# Patient Record
Sex: Female | Born: 1960 | Race: Black or African American | Hispanic: No | Marital: Single | Smoking: Never smoker
Health system: Southern US, Community
[De-identification: ages and names within clinical notes are randomized; demographics above are authoritative.]

---

## 2021-08-18 ENCOUNTER — Emergency Department (HOSPITAL_COMMUNITY): Payer: Medicaid Other

## 2021-08-18 ENCOUNTER — Other Ambulatory Visit: Payer: Self-pay

## 2021-08-18 ENCOUNTER — Inpatient Hospital Stay (HOSPITAL_COMMUNITY)
Admission: EM | Admit: 2021-08-18 | Discharge: 2021-09-06 | DRG: 683 | Disposition: A | Discharge status: Home / Self Care | Payer: Medicaid Other | Attending: Internal Medicine | Admitting: Internal Medicine

## 2021-08-18 DIAGNOSIS — K7031 Alcoholic cirrhosis of liver with ascites: Secondary | ICD-10-CM | POA: Diagnosis present

## 2021-08-18 DIAGNOSIS — E440 Moderate protein-calorie malnutrition: Secondary | ICD-10-CM | POA: Diagnosis present

## 2021-08-18 DIAGNOSIS — Z882 Allergy status to sulfonamides status: Secondary | ICD-10-CM

## 2021-08-18 DIAGNOSIS — R45851 Suicidal ideations: Secondary | ICD-10-CM | POA: Diagnosis present

## 2021-08-18 DIAGNOSIS — F4310 Post-traumatic stress disorder, unspecified: Secondary | ICD-10-CM | POA: Diagnosis present

## 2021-08-18 DIAGNOSIS — I5022 Chronic systolic (congestive) heart failure: Secondary | ICD-10-CM | POA: Diagnosis present

## 2021-08-18 DIAGNOSIS — I071 Rheumatic tricuspid insufficiency: Secondary | ICD-10-CM | POA: Diagnosis present

## 2021-08-18 DIAGNOSIS — J9611 Chronic respiratory failure with hypoxia: Secondary | ICD-10-CM | POA: Diagnosis present

## 2021-08-18 DIAGNOSIS — Z86718 Personal history of other venous thrombosis and embolism: Secondary | ICD-10-CM

## 2021-08-18 DIAGNOSIS — Z9151 Personal history of suicidal behavior: Secondary | ICD-10-CM

## 2021-08-18 DIAGNOSIS — Z9114 Patient's other noncompliance with medication regimen: Secondary | ICD-10-CM

## 2021-08-18 DIAGNOSIS — K7030 Alcoholic cirrhosis of liver without ascites: Secondary | ICD-10-CM

## 2021-08-18 DIAGNOSIS — Z86711 Personal history of pulmonary embolism: Secondary | ICD-10-CM

## 2021-08-18 DIAGNOSIS — H547 Unspecified visual loss: Secondary | ICD-10-CM | POA: Diagnosis present

## 2021-08-18 DIAGNOSIS — Z7901 Long term (current) use of anticoagulants: Secondary | ICD-10-CM

## 2021-08-18 DIAGNOSIS — G4700 Insomnia, unspecified: Secondary | ICD-10-CM | POA: Diagnosis present

## 2021-08-18 DIAGNOSIS — R0789 Other chest pain: Secondary | ICD-10-CM | POA: Diagnosis present

## 2021-08-18 DIAGNOSIS — Z91199 Patient's noncompliance with other medical treatment and regimen due to unspecified reason: Secondary | ICD-10-CM

## 2021-08-18 DIAGNOSIS — Z20822 Contact with and (suspected) exposure to covid-19: Secondary | ICD-10-CM | POA: Diagnosis present

## 2021-08-18 DIAGNOSIS — Z91041 Radiographic dye allergy status: Secondary | ICD-10-CM

## 2021-08-18 DIAGNOSIS — I2699 Other pulmonary embolism without acute cor pulmonale: Secondary | ICD-10-CM

## 2021-08-18 DIAGNOSIS — Z28310 Unvaccinated for covid-19: Secondary | ICD-10-CM

## 2021-08-18 DIAGNOSIS — Z888 Allergy status to other drugs, medicaments and biological substances status: Secondary | ICD-10-CM

## 2021-08-18 DIAGNOSIS — R9431 Abnormal electrocardiogram [ECG] [EKG]: Secondary | ICD-10-CM

## 2021-08-18 DIAGNOSIS — Z95828 Presence of other vascular implants and grafts: Secondary | ICD-10-CM

## 2021-08-18 DIAGNOSIS — Z885 Allergy status to narcotic agent status: Secondary | ICD-10-CM

## 2021-08-18 DIAGNOSIS — N179 Acute kidney failure, unspecified: Principal | ICD-10-CM | POA: Diagnosis present

## 2021-08-18 DIAGNOSIS — I110 Hypertensive heart disease with heart failure: Secondary | ICD-10-CM | POA: Diagnosis present

## 2021-08-18 DIAGNOSIS — D72819 Decreased white blood cell count, unspecified: Secondary | ICD-10-CM | POA: Diagnosis present

## 2021-08-18 DIAGNOSIS — I482 Chronic atrial fibrillation, unspecified: Secondary | ICD-10-CM

## 2021-08-18 DIAGNOSIS — E8809 Other disorders of plasma-protein metabolism, not elsewhere classified: Secondary | ICD-10-CM | POA: Diagnosis present

## 2021-08-18 DIAGNOSIS — Z681 Body mass index (BMI) 19 or less, adult: Secondary | ICD-10-CM

## 2021-08-18 DIAGNOSIS — Z5901 Sheltered homelessness: Secondary | ICD-10-CM

## 2021-08-18 DIAGNOSIS — F332 Major depressive disorder, recurrent severe without psychotic features: Secondary | ICD-10-CM | POA: Diagnosis present

## 2021-08-18 DIAGNOSIS — Z79899 Other long term (current) drug therapy: Secondary | ICD-10-CM

## 2021-08-18 DIAGNOSIS — Z9981 Dependence on supplemental oxygen: Secondary | ICD-10-CM

## 2021-08-18 DIAGNOSIS — I4821 Permanent atrial fibrillation: Secondary | ICD-10-CM | POA: Diagnosis present

## 2021-08-18 DIAGNOSIS — I959 Hypotension, unspecified: Secondary | ICD-10-CM | POA: Diagnosis present

## 2021-08-18 LAB — BASIC METABOLIC PANEL
Anion gap: 12 (ref 5–15)
BUN: 18 mg/dL (ref 6–20)
CO2: 24 mmol/L (ref 22–32)
Calcium: 10.9 mg/dL — ABNORMAL HIGH (ref 8.9–10.3)
Chloride: 99 mmol/L (ref 98–111)
Creatinine, Ser: 2 mg/dL — ABNORMAL HIGH (ref 0.44–1.00)
GFR, Estimated: 28 mL/min — ABNORMAL LOW (ref >60)
Glucose, Bld: 92 mg/dL (ref 70–99)
Potassium: 4 mmol/L (ref 3.5–5.1)
Sodium: 135 mmol/L (ref 135–145)

## 2021-08-18 LAB — CBC WITH DIFFERENTIAL/PLATELET
Abs Immature Granulocytes: 0.01 10*3/uL (ref 0.00–0.07)
Basophils Absolute: 0 10*3/uL (ref 0.0–0.1)
Basophils Relative: 0 %
Eosinophils Absolute: 0 10*3/uL (ref 0.0–0.5)
Eosinophils Relative: 1 %
HCT: 41.2 % (ref 36.0–46.0)
Hemoglobin: 13 g/dL (ref 12.0–15.0)
Immature Granulocytes: 0 %
Lymphocytes Relative: 26 %
Lymphs Abs: 1.2 10*3/uL (ref 0.7–4.0)
MCH: 30.1 pg (ref 26.0–34.0)
MCHC: 31.6 g/dL (ref 30.0–36.0)
MCV: 95.4 fL (ref 80.0–100.0)
Monocytes Absolute: 0.6 10*3/uL (ref 0.1–1.0)
Monocytes Relative: 13 %
Neutro Abs: 2.7 10*3/uL (ref 1.7–7.7)
Neutrophils Relative %: 60 %
Platelets: 283 10*3/uL (ref 150–400)
RBC: 4.32 MIL/uL (ref 3.87–5.11)
RDW: 16.2 % — ABNORMAL HIGH (ref 11.5–15.5)
WBC: 4.6 10*3/uL (ref 4.0–10.5)
nRBC: 0 % (ref 0.0–0.2)

## 2021-08-18 LAB — TROPONIN I (HIGH SENSITIVITY): Troponin I (High Sensitivity): 15 ng/L (ref <18)

## 2021-08-18 MED ORDER — FERROUS SULFATE 325 (65 FE) MG PO TABS: 325.0000 mg | ORAL_TABLET | Freq: Every day | ORAL | Status: DC

## 2021-08-18 MED ORDER — SODIUM CHLORIDE 0.9 % IV BOLUS
500.0000 mL | Freq: Once | INTRAVENOUS | Status: AC
  Administered 2021-08-18: 500 mL via INTRAVENOUS

## 2021-08-18 MED ORDER — PANTOPRAZOLE SODIUM 20 MG PO TBEC: 20.0000 mg | DELAYED_RELEASE_TABLET | Freq: Every day | ORAL | Status: DC

## 2021-08-18 MED ORDER — PANTOPRAZOLE SODIUM 20 MG PO TBEC
20.0000 mg | DELAYED_RELEASE_TABLET | Freq: Every day | ORAL | Status: DC
  Administered 2021-08-19 – 2021-09-06 (×19): 20 mg via ORAL
  Filled 2021-08-18 (×19): qty 1

## 2021-08-18 MED ORDER — APIXABAN 5 MG PO TABS
5.0000 mg | ORAL_TABLET | Freq: Two times a day (BID) | ORAL | Status: DC
  Administered 2021-08-19 – 2021-09-06 (×37): 5 mg via ORAL
  Filled 2021-08-18 (×39): qty 1

## 2021-08-18 MED ORDER — SPIRONOLACTONE 25 MG PO TABS
25.0000 mg | ORAL_TABLET | Freq: Every day | ORAL | Status: DC
  Administered 2021-08-19: 25 mg via ORAL
  Filled 2021-08-18: qty 1

## 2021-08-18 MED ORDER — METOPROLOL TARTRATE 25 MG PO TABS
25.0000 mg | ORAL_TABLET | Freq: Two times a day (BID) | ORAL | Status: DC
  Administered 2021-08-19: 25 mg via ORAL
  Filled 2021-08-18: qty 1

## 2021-08-18 MED ORDER — TORSEMIDE 20 MG PO TABS
10.0000 mg | ORAL_TABLET | Freq: Every day | ORAL | Status: DC
  Administered 2021-08-19: 10 mg via ORAL
  Filled 2021-08-18: qty 1

## 2021-08-18 MED ORDER — FOLIC ACID 1 MG PO TABS
1.0000 mg | ORAL_TABLET | Freq: Every day | ORAL | Status: DC
  Administered 2021-08-19 – 2021-09-06 (×19): 1 mg via ORAL
  Filled 2021-08-18 (×19): qty 1

## 2021-08-18 MED ORDER — FERROUS SULFATE 325 (65 FE) MG PO TABS
325.0000 mg | ORAL_TABLET | Freq: Every day | ORAL | Status: DC
  Administered 2021-08-19 – 2021-08-27 (×9): 325 mg via ORAL
  Filled 2021-08-18 (×9): qty 1

## 2021-08-18 MED ORDER — TORSEMIDE 20 MG PO TABS: 10.0000 mg | ORAL_TABLET | Freq: Every day | ORAL | Status: DC

## 2021-08-18 MED ORDER — THIAMINE HCL 100 MG PO TABS
100.0000 mg | ORAL_TABLET | Freq: Every day | ORAL | Status: DC
  Administered 2021-08-19 – 2021-09-06 (×20): 100 mg via ORAL
  Filled 2021-08-18 (×20): qty 1

## 2021-08-18 MED ORDER — FOLIC ACID 1 MG PO TABS: 1.0000 mg | ORAL_TABLET | Freq: Every day | ORAL | Status: DC

## 2021-08-18 NOTE — ED Provider Notes (Signed)
°  Provider Note MRN:  [id number]  Arrival date & time: 08/19/21    ED Course and Medical Decision Making  Assumed care from Dr. Fredderick Phenix at shift change.  Recent discharge from [facility], brought here by EMS after shelters in the area did not have any room for her.  Found to have a bit of an increased creatinine, soft blood pressures, hospitalist consulted for admission.  Procedures  Final Clinical Impressions(s) / ED Diagnoses     ICD-10-CM   1. Hypotension, unspecified hypotension type  I95.9       ED Discharge Orders     None        Elmer Sow. Pilar Plate, MD [facility] Emergency Medicine [facility] [email or web address]    Sabas Sous, MD 08/19/21 [phone]

## 2021-08-18 NOTE — ED Provider Notes (Signed)
[facility] EMERGENCY DEPARTMENT Provider Note   CSN: [id number] Arrival date & time: 08/18/21  1700     History Chief Complaint  Patient presents with   Chest Pain    Adrienne Villarreal is a 60 y.o. female.  Patient is a 60 year old female with a history of prior alcohol abuse, alcoholic cirrhosis, atrial fibrillation and PEs on Eliquis, anxiety, chronic respiratory failure on oxygen at 3 L/min baseline.  She had a recent hospitalization at [facility] from December 8 and was discharged today.  She was found to be hypoxic and fluid overloaded.  She was diuresed.  She had a paracentesis.  She was also found to be noncompliant with her Eliquis and had PEs.  She said that she had been having some chest pain and vomiting while she was in the hospital.  She was discharged to a alcoholic rehab center.  This was today.  Apparently when she got there, she said that the center said that she was not allowed to be there because she had a walker.  The driver who took her there drove her to a homeless shelter in [place] but they did not have a bed available.  She was feeling generally weak and was brought here for further evaluation.  She says she has been generally weak since she was in the hospital.  She had some cold numb hands while she was being seen at the homeless shelter.  She does have a bit of ongoing chest pain but she says it similar although better than when she was in the hospital.  It is left-sided.  No current shortness of breath.  She says she does continue to drink alcohol but has not had any today since she left the hospital.  She has a prior history of drug use but denies any drug use.  She currently feels like she needs to go to an assisted living facility.      No past medical history on file.  There are no problems to display for this patient.    The histories are not reviewed yet. Please review them in the "History" navigator section and refresh  this [place].   OB History   No obstetric history on file.     No family history on file.     Home Medications Prior to Admission medications   Medication Sig Start Date End Date Taking? Authorizing Provider  apixaban (ELIQUIS) 5 MG TABS tablet Take by mouth. 06/19/21  Yes [provider]  Cholecalciferol 25 MCG (1000 UT) tablet Take by mouth. 08/18/21 09/17/21 Yes [provider]  ferrous sulfate 325 (65 FE) MG tablet Take by mouth. 08/18/21 09/17/21 Yes [provider]  folic acid (FOLVITE) 1 MG tablet Take by mouth. 06/19/21 09/17/21 Yes [provider]  LORazepam (ATIVAN) 0.5 MG tablet Take 0.5 mg by mouth every 6 (six) hours as needed for anxiety or sedation.   Yes [provider]  metoprolol tartrate (LOPRESSOR) 25 MG tablet Take by mouth. 06/19/21 09/17/21 Yes [provider]  morphine (MSIR) 15 MG tablet Take 15 mg by mouth every 6 (six) hours as needed for severe pain.   Yes [provider]  pantoprazole (PROTONIX) 40 MG tablet Take by mouth. 08/18/21 09/17/21 Yes [provider]  spironolactone (ALDACTONE) 25 MG tablet Take by mouth. 06/19/21 09/17/21 Yes [provider]  thiamine 100 MG tablet Take by mouth. 06/19/21 09/17/21 Yes [provider]  torsemide (DEMADEX) 10 MG tablet Take by  mouth. 08/18/21 09/17/21 Yes [provider]    Allergies    Patient has no allergy information on record.  Review of Systems   Review of Systems  Constitutional:  Positive for fatigue. Negative for chills, diaphoresis and fever.  HENT:  Negative for congestion, rhinorrhea and sneezing.   Eyes: Negative.   Respiratory:  Negative for cough, chest tightness and shortness of breath.   Cardiovascular:  Positive for chest pain. Negative for leg swelling.  Gastrointestinal:  Negative for abdominal pain, blood in stool, diarrhea, nausea and vomiting.  Genitourinary:  Negative for difficulty urinating,  flank pain, frequency and hematuria.  Musculoskeletal:  Negative for arthralgias and back pain.  Skin:  Negative for rash.  Neurological:  Negative for dizziness, speech difficulty, weakness, numbness and headaches.   Physical Exam Updated Vital Signs BP (!) 87/60    Pulse 89    Temp (!) 97.5 F (36.4 C) (Oral)    Resp 16    Ht 5\' 3"  (1.6 m)    Wt 49.9 kg    SpO2 99%    BMI 19.49 kg/m   Physical Exam Constitutional:      Appearance: She is well-developed.  HENT:     Head: Normocephalic and atraumatic.  Eyes:     Pupils: Pupils are equal, round, and reactive to light.  Cardiovascular:     Rate and Rhythm: Normal rate and regular rhythm.     Heart sounds: Murmur heard.  Pulmonary:     Effort: Pulmonary effort is normal. No respiratory distress.     Breath sounds: Normal breath sounds. No wheezing or rales.  Chest:     Chest wall: No tenderness.  Abdominal:     General: Bowel sounds are normal.     Palpations: Abdomen is soft.     Tenderness: There is no abdominal tenderness. There is no guarding or rebound.  Musculoskeletal:        General: Normal range of motion.     Cervical back: Normal range of motion and neck supple.     Right lower leg: No edema.     Left lower leg: No edema.  Lymphadenopathy:     Cervical: No cervical adenopathy.  Skin:    General: Skin is warm and dry.     Findings: No rash.  Neurological:     Mental Status: She is alert and oriented to person, place, and time.    ED Results / Procedures / Treatments   Labs (all labs ordered are listed, but only abnormal results are displayed) Labs Reviewed  BASIC METABOLIC PANEL - Abnormal; Notable for the following components:      Result Value   Creatinine, Ser 2.00 (*)    Calcium 10.9 (*)    GFR, Estimated 28 (*)    All other components within normal limits  CBC WITH DIFFERENTIAL/PLATELET - Abnormal; Notable for the following components:   RDW 16.2 (*)    All other components within normal limits   TROPONIN I (HIGH SENSITIVITY)  TROPONIN I (HIGH SENSITIVITY)    EKG EKG Interpretation  Date/Time:  Friday August 18 2021 17:13:25 EST Ventricular Rate:  88 PR Interval:    QRS Duration: 133 QT Interval:  396 QTC Calculation: 480 R Axis:   84 Text Interpretation: Atrial fibrillation IVCD, consider atypical RBBB Nonspecific T abnormalities, lateral leads No old tracing to compare Confirmed by Malvin Johns [phone]) on 08/18/2021 5:17:31 PM  Radiology DG Chest Port 1 View  Result Date: 08/18/2021 CLINICAL DATA:  Shortness of breath EXAM: PORTABLE CHEST 1 VIEW COMPARISON:  None. FINDINGS: Enlarged cardiac silhouette. Trace left pleural effusion. No focal airspace consolidation. Right midlung scarring. No pneumothorax. No acute osseous abnormality. IMPRESSION: Cardiomegaly.  Trace left pleural effusion. Electronically Signed   By: Caprice Renshaw M.D.   On: 08/18/2021 18:10    Procedures Procedures   Medications Ordered in ED Medications  apixaban (ELIQUIS) tablet 5 mg (has no administration in time range)  metoprolol tartrate (LOPRESSOR) tablet 25 mg (0 mg Oral Hold 08/18/21 2249)  spironolactone (ALDACTONE) tablet 25 mg (has no administration in time range)  thiamine tablet 100 mg (has no administration in time range)  torsemide (DEMADEX) tablet 10 mg (has no administration in time range)  pantoprazole (PROTONIX) EC tablet 20 mg (has no administration in time range)  folic acid (FOLVITE) tablet 1 mg (has no administration in time range)  ferrous sulfate tablet 325 mg (has no administration in time range)  sodium chloride 0.9 % bolus 500 mL (0 mLs Intravenous Stopped 08/18/21 2030)  sodium chloride 0.9 % bolus 500 mL (500 mLs Intravenous New Bag/Given 08/18/21 2250)    ED Course  I have reviewed the triage vital signs and the nursing notes.  Pertinent labs & imaging results that were available during my care of the patient were reviewed by me and considered in my medical  decision making (see chart for details).    MDM Rules/Calculators/A&P                         Patient is a 60 year old female who was discharged today from [facility].  She was diuresed and had a paracentesis.  She was unable to get into a rehab facility or shelter.  She came in with generalized weakness and some dizziness/lightheadedness.  She had had some chest pain which has been going on during her entire hospitalization but has improved.  Her troponins are negative.  Her EKG does not show any ischemic changes.  Her labs are nonconcerning.  Her creatinine is a bit elevated from her baseline during her hospitalization of around 1.  It is 2 today.  I initially had talked to case management who has been trying to get her into a local rehab facility.  However her blood pressures have been low.  They have been ranging between 70 systolic and 110 systolic.  She is not tachycardic.  She is not febrile.  She does not have any abdominal pain that would be concerning for SPB.  Her chest x-ray is clear.  I suspect that she potentially was over diuresed during the hospitalization.  Will hydrate with IV fluids.  I spoke with Dr.Tu with the hospitalist service.  She wanted [place] to get a full year in and call her back following that.  Care was turned over to Dr. Pilar Plate.    Final Clinical Impression(s) / ED Diagnoses Final diagnoses:  None    Rx / DC Orders ED Discharge Orders     None        Rolan Bucco, MD 08/18/21 2316

## 2021-08-18 NOTE — Social Work (Signed)
CSW met with Pt at bedside. Pt endorses SUD states her last drink was last week and that she was experiencing withdrawal symptoms while hospitalized at [facility]. Pt reports that she was d/c'd from [facility] today and sent to [facility]  for rehab but upon arrival was turned away due to requiring the use of a rollator for ambulation. Pt was then taken to a [facility] by transportation driver but as there are no shelter beds was turned away from there as well. Pt became ill once again and transport brought her to [facility]. Per Pt she [place] living [place] one [place] two [place] with her daughter. Before that she was living in [facility] with her son. Pt reports that both of her children have "turned their back"on her and she insists that no one at [facility] call her children or she will file a lawsuit for breech of privacy. Pt states that she wishes to go to a nursing home and is willing to give over her $900/ monthly disability check for room and board. CSW explained that ED does not place Pt's in assisted living because it takes time.  CSW asked Pt if she has any money left from her disability and if she could afford a motel room. Pt states that she does not wish to spend money on a motel room because she needs that money to eat.  CSW will attempt to locate a rehab facility that will accept a PT with a rollater.

## 2021-08-18 NOTE — ED Notes (Signed)
Patient provided with crackers and cheese per request

## 2021-08-18 NOTE — Progress Notes (Signed)
Pt states that she has already contacted [facility] and put her name on the waitlist.

## 2021-08-18 NOTE — Progress Notes (Addendum)
CSW called the following : [facility]-closed for the night [employer] -accepts only [facility] [place] only [facility]-closed  [employer] [place] only [facility] [place] only [employer] [place]-Closed  [place]. Pt states her ID lists [place] as her residence.

## 2021-08-18 NOTE — Discharge Instructions (Addendum)
[place] at [facility] [address], [place], [place] [address]  [phone]  [place] at [facility] [address] [place], [place] [address]  [phone]  [facility] [place]-Accepts Medicaid [address] Madelaine Bhat [place], [place]  [phone] [place], [place], [place] [address]  [phone]  [facility] [address], [place], [place] [address]  [phone]  [place] Medicaid [place], [place], [place] [address]  [phone] [address] [place], [place] [address]  [phone]  [facility] [place], [place], [place] [address]  [phone]  [facility] [address], [place], [place] [address]  [phone]  [facility]-Accepts Medicaid. [place], [place], [place] [address]  [phone]  [place] at [place], [place], [place] [address]  [phone]  [place] Medicaid [address] [place], [place] [address]  [phone]  [place] at [facility] [address] [place], [place] [address]  [phone]  [facility] [place], [place], [place] [address]  [phone]  [facility] [address], [place], [place] [address]  [phone] [address] [place],  [address]  [phone]  [place] Medicaid [place], [place],  [address]  [phone]      Information on my medicine - ELIQUIS (apixaban)  This medication education was reviewed with me or my healthcare representative as part of my discharge preparation.  The pharmacist that spoke with me during my hospital stay was:    Why was Eliquis prescribed for you? Eliquis was prescribed for you to reduce the risk of a blood clot forming that can cause a stroke if you  have a medical condition called atrial fibrillation (a type of irregular heartbeat).  What do You need to know about Eliquis ? Take your Eliquis TWICE DAILY - one tablet in the morning and one tablet in the evening with or without food. If you have difficulty swallowing the tablet whole please discuss with your pharmacist how to take the medication safely.  Take Eliquis exactly as prescribed by your doctor and DO NOT stop taking Eliquis without talking to the doctor who prescribed the medication.  Stopping may increase your risk of developing a stroke.  Refill your prescription before you run out.  After discharge, you should have regular check-up appointments with your healthcare provider that is prescribing your Eliquis.  In the future your dose may need to be changed if your kidney function or weight changes by a significant amount or as you get older.  What do you do if you miss a dose? If you miss a dose, take it as soon as you remember on the same day and resume taking twice daily.  Do not take more than one dose of ELIQUIS at the same time to make up a missed dose.  Important Safety Information A possible side effect of Eliquis is bleeding. You should call your healthcare provider right away if you experience any of the following: Bleeding from an injury or your nose that does not stop. Unusual colored urine (red or dark brown) or unusual colored stools (red or black). Unusual bruising for unknown reasons. A serious fall or if you hit your head (even if there is no bleeding).  Some medicines may interact with Eliquis and might  increase your risk of bleeding or clotting while on Eliquis. To help avoid this, consult your healthcare provider or pharmacist prior to using any new prescription or non-prescription medications, including herbals, vitamins, non-steroidal anti-inflammatory drugs (NSAIDs) and supplements.  This website has more information on Eliquis (apixaban):  http://www.eliquis.com/eliquis/home  Please call [phone] to schedule follow up appointments with Nat Math, MSW, LCSW-A at [facility] for further tele-health visits.

## 2021-08-18 NOTE — ED Notes (Signed)
[facility] paperwork done copies made original in the red folder remaining 3 copies placed in [facility] packet and given to nurse tiffany

## 2021-08-18 NOTE — ED Triage Notes (Signed)
Pt BIB [facility] c/o chest wall pain. Hx afib, currently taking meds. No hx diabetes. Discharged from [facility] today, went to a shelter but was rejected.   EMS VS- 99% RA, initial HR 40, then 100 CBG 230. Last BP 80/56, previous was 100/70.

## 2021-08-19 ENCOUNTER — Encounter (HOSPITAL_COMMUNITY): Payer: Self-pay | Admitting: Family Medicine

## 2021-08-19 DIAGNOSIS — R9431 Abnormal electrocardiogram [ECG] [EKG]: Secondary | ICD-10-CM

## 2021-08-19 DIAGNOSIS — I2699 Other pulmonary embolism without acute cor pulmonale: Secondary | ICD-10-CM

## 2021-08-19 DIAGNOSIS — Z91199 Patient's noncompliance with other medical treatment and regimen due to unspecified reason: Secondary | ICD-10-CM

## 2021-08-19 DIAGNOSIS — I959 Hypotension, unspecified: Secondary | ICD-10-CM

## 2021-08-19 DIAGNOSIS — K7030 Alcoholic cirrhosis of liver without ascites: Secondary | ICD-10-CM

## 2021-08-19 DIAGNOSIS — Z7901 Long term (current) use of anticoagulants: Secondary | ICD-10-CM

## 2021-08-19 DIAGNOSIS — I5022 Chronic systolic (congestive) heart failure: Secondary | ICD-10-CM

## 2021-08-19 DIAGNOSIS — N179 Acute kidney failure, unspecified: Secondary | ICD-10-CM | POA: Diagnosis not present

## 2021-08-19 DIAGNOSIS — I482 Chronic atrial fibrillation, unspecified: Secondary | ICD-10-CM

## 2021-08-19 LAB — URINALYSIS, ROUTINE W REFLEX MICROSCOPIC
Bilirubin Urine: NEGATIVE
Glucose, UA: NEGATIVE mg/dL
Hgb urine dipstick: NEGATIVE
Ketones, ur: NEGATIVE mg/dL
Leukocytes,Ua: NEGATIVE
Nitrite: NEGATIVE
Protein, ur: NEGATIVE mg/dL
Specific Gravity, Urine: <1.005 — ABNORMAL LOW (ref 1.005–1.030)
pH: 6 (ref 5.0–8.0)

## 2021-08-19 LAB — BASIC METABOLIC PANEL
Anion gap: 10 (ref 5–15)
BUN: 23 mg/dL — ABNORMAL HIGH (ref 6–20)
CO2: 25 mmol/L (ref 22–32)
Calcium: 10.4 mg/dL — ABNORMAL HIGH (ref 8.9–10.3)
Chloride: 103 mmol/L (ref 98–111)
Creatinine, Ser: 2.34 mg/dL — ABNORMAL HIGH (ref 0.44–1.00)
GFR, Estimated: 23 mL/min — ABNORMAL LOW (ref >60)
Glucose, Bld: 97 mg/dL (ref 70–99)
Potassium: 3.6 mmol/L (ref 3.5–5.1)
Sodium: 138 mmol/L (ref 135–145)

## 2021-08-19 LAB — CBC
HCT: 36.9 % (ref 36.0–46.0)
Hemoglobin: 11.4 g/dL — ABNORMAL LOW (ref 12.0–15.0)
MCH: 29.8 pg (ref 26.0–34.0)
MCHC: 30.9 g/dL (ref 30.0–36.0)
MCV: 96.6 fL (ref 80.0–100.0)
Platelets: 253 10*3/uL (ref 150–400)
RBC: 3.82 MIL/uL — ABNORMAL LOW (ref 3.87–5.11)
RDW: 16.4 % — ABNORMAL HIGH (ref 11.5–15.5)
WBC: 3.9 10*3/uL — ABNORMAL LOW (ref 4.0–10.5)
nRBC: 0 % (ref 0.0–0.2)

## 2021-08-19 LAB — RAPID URINE DRUG SCREEN, HOSP PERFORMED
Amphetamines: NOT DETECTED
Barbiturates: NOT DETECTED
Benzodiazepines: POSITIVE — AB
Cocaine: NOT DETECTED
Opiates: POSITIVE — AB
Tetrahydrocannabinol: NOT DETECTED

## 2021-08-19 LAB — RESP PANEL BY RT-PCR (FLU A&B, COVID) ARPGX2
Influenza A by PCR: NEGATIVE
Influenza B by PCR: NEGATIVE
SARS Coronavirus 2 by RT PCR: NEGATIVE

## 2021-08-19 LAB — TROPONIN I (HIGH SENSITIVITY)
Troponin I (High Sensitivity): 15 ng/L (ref <18)
Troponin I (High Sensitivity): 15 ng/L (ref <18)

## 2021-08-19 LAB — HIV ANTIBODY (ROUTINE TESTING W REFLEX): HIV Screen 4th Generation wRfx: NONREACTIVE

## 2021-08-19 LAB — CBG MONITORING, ED: Glucose-Capillary: 95 mg/dL (ref 70–99)

## 2021-08-19 MED ORDER — LACTATED RINGERS IV SOLN: INTRAVENOUS | Status: DC

## 2021-08-19 MED ORDER — LORAZEPAM 1 MG PO TABS
1.0000 mg | ORAL_TABLET | ORAL | Status: DC | PRN
  Administered 2021-08-19: 1 mg via ORAL
  Filled 2021-08-19: qty 1

## 2021-08-19 MED ORDER — MORPHINE SULFATE (PF) 2 MG/ML IV SOLN: 2.0000 mg | INTRAVENOUS | Status: DC | PRN

## 2021-08-19 MED ORDER — LORAZEPAM 2 MG/ML IJ SOLN: 1.0000 mg | INTRAMUSCULAR | Status: DC | PRN

## 2021-08-19 MED ORDER — METOPROLOL TARTRATE 12.5 MG HALF TABLET
12.5000 mg | ORAL_TABLET | Freq: Two times a day (BID) | ORAL | Status: DC
  Administered 2021-08-20 – 2021-09-06 (×25): 12.5 mg via ORAL
  Filled 2021-08-19 (×34): qty 1

## 2021-08-19 MED ORDER — TORSEMIDE 20 MG PO TABS: 10.0000 mg | ORAL_TABLET | Freq: Every day | ORAL | Status: DC

## 2021-08-19 MED ORDER — ADULT MULTIVITAMIN W/MINERALS CH
1.0000 | ORAL_TABLET | Freq: Every day | ORAL | Status: DC
  Administered 2021-08-19 – 2021-09-06 (×19): 1 via ORAL
  Filled 2021-08-19 (×19): qty 1

## 2021-08-19 MED ORDER — LACTULOSE 10 GM/15ML PO SOLN
10.0000 g | Freq: Two times a day (BID) | ORAL | Status: DC
  Administered 2021-08-19 – 2021-08-29 (×7): 10 g via ORAL
  Filled 2021-08-19 (×15): qty 15
  Filled 2021-08-19: qty 30
  Filled 2021-08-19 (×16): qty 15

## 2021-08-19 MED ORDER — SPIRONOLACTONE 25 MG PO TABS: 25.0000 mg | ORAL_TABLET | Freq: Every day | ORAL | Status: DC

## 2021-08-19 MED ORDER — LORAZEPAM 0.5 MG PO TABS
0.5000 mg | ORAL_TABLET | Freq: Two times a day (BID) | ORAL | Status: DC | PRN
  Administered 2021-08-19 – 2021-09-06 (×32): 0.5 mg via ORAL
  Filled 2021-08-19 (×35): qty 1

## 2021-08-19 MED ORDER — SENNOSIDES-DOCUSATE SODIUM 8.6-50 MG PO TABS: 1.0000 | ORAL_TABLET | Freq: Every evening | ORAL | Status: DC | PRN

## 2021-08-19 MED ORDER — LACTATED RINGERS IV SOLN: INTRAVENOUS | Status: AC

## 2021-08-19 MED ORDER — ACETAMINOPHEN 325 MG PO TABS
650.0000 mg | ORAL_TABLET | Freq: Four times a day (QID) | ORAL | Status: DC | PRN
  Filled 2021-08-19 (×2): qty 2

## 2021-08-19 MED ORDER — MORPHINE SULFATE (PF) 2 MG/ML IV SOLN
2.0000 mg | Freq: Once | INTRAVENOUS | Status: AC
  Administered 2021-08-19: 2 mg via INTRAVENOUS
  Filled 2021-08-19: qty 1

## 2021-08-19 NOTE — ED Notes (Signed)
Patient updated on plan of care

## 2021-08-19 NOTE — ED Notes (Signed)
Called 87M for purple man assignment.

## 2021-08-19 NOTE — ED Notes (Signed)
Patient requesting medication for her "tremors and shaking."  States it is from her ETOH withdrawal.  Patient unable to tell RN when last use of ETOH was.  States "I was recently in the hospital and they were giving me Ativan."  Admit MD paged

## 2021-08-19 NOTE — H&P (Deleted)
HOSPITAL MEDICINE OVERNIGHT EVENT NOTE   ° °Nursing reports that patient is requesting medication "for my withdrawals." ° °Nursing cannot verify that patient is exhibiting any objective evidence of alcohol withdrawal at this time and does not see the patient actually exhibiting any tremors. ° °Considering history of alcohol abuse, we will go ahead and initiate CIWA protocol without and a scheduled Ativan taper.  If patient happens to score high enough CIWA score then a PRN benzodiazepine can be administered otherwise we will continue to monitor. ° °Margaux Engen J Rozell Kettlewell  MD °[facility]  °

## 2021-08-19 NOTE — Progress Notes (Signed)
Patient c/o aching rt chest pain,rated 6/10. VSS. No acute distress noted. [place],MD notified via secure chat. MD able to talk with patient via phone. Order received,explained to patient MD order. Patient verbalized understanding.

## 2021-08-19 NOTE — Plan of Care (Signed)
  Problem: Education: Goal: Knowledge of General Education information will improve Description Including pain rating scale, medication(s)/side effects and non-pharmacologic comfort measures Outcome: Progressing   Problem: Health Behavior/Discharge Planning: Goal: Ability to manage health-related needs will improve Outcome: Progressing   

## 2021-08-19 NOTE — H&P (Signed)
History and Physical    Adrienne Villarreal [id number] DOB: [birth year] DOA: 08/18/2021  PCP: Pcp, No   Patient coming from: [place] by EMS  Chief Complaint: weakness  HPI: Adrienne Villarreal is a 60 y.o. female with medical history significant for prior alcohol abuse, alcoholic cirrhosis, CHF, atrial fibrillation and PEs on Eliquis, anxiety. Has not had alcohol in over a week. Was admitted from Dec. 8-16 at [facility] for cirrhosis with ascites, SOB. She had a paracentesis and was diuresed while there.  She is also found to have pulmonary embolism that she had not been compliant with her anticoagulation and was restarted on Eliquis.  She reports she did have some chest pain while in the hospital require any intervention.  She was discharged to an alcohol rehab center on the morning of December 16 but when she got to the center she was told they could not take her as she has a walker that she ambulates with.  She was then driven to a homeless shelter but they did not have any beds available for her and was sent to the emergency room for evaluation of generalized weakness.  In the emergency room she was found to have a low blood pressure with systolic pressure in the [id number] range.  She complained of some ongoing chest pain and discomfort that did not radiate.  She has chronic shortness of breath with exertion no shortness of breath when at rest.  She has not had any nausea or vomiting.  She states she has not had any alcohol in over a week and does not feel tremulous.  She denies tobacco use or illicit drug use.  She is currently homeless and unable to care for herself.  ED Course: She was given IV fluids in the emergency room and her blood pressure improved with systolic pressure in the [id number] range.  Opponent was 15.  She was found to have mild AKI with a creatinine of 2.  According to our records her baseline creatinine is 1.  Electrolytes are normal.  CBC is unremarkable.  COVID swab  is pending.  Hospitalist service asked to observe patient overnight and monitor blood pressure and have social work assist with rehab placement in the morning  Review of Systems:  General: Denies fever, chills, weight loss, night sweats.  Denies dizziness.  Denies change in appetite HENT: Denies head trauma, headache, denies change in hearing, Denies nasal congestion or bleeding.  Denies sore throat.  Denies difficulty swallowing Eyes: Denies blurry vision, pain in eye, drainage.  Denies discoloration of eyes. Neck: Denies pain.  Denies swelling.  Denies pain with movement. Cardiovascular: Denies chest pain, palpitations.  Denies edema.  Denies orthopnea Respiratory: Denies shortness of breath, cough.  Denies wheezing.  Denies sputum production Gastrointestinal: Denies abdominal pain, swelling.  Denies nausea, vomiting, diarrhea.  Denies melena.  Denies hematemesis. Musculoskeletal: Denies limitation of movement.  Denies deformity or swelling.  Denies pain.  Denies arthralgias or myalgias. Genitourinary: Denies pelvic pain.  Denies urinary frequency or hesitancy.  Denies dysuria.  Skin: Denies rash.  Denies petechiae, purpura, ecchymosis. Neurological: Denies syncope. Denies seizure activity. Denies paresthesia. Denies slurred speech, drooping face.  Denies visual change. Psychiatric: Denies depression, anxiety. Denies hallucinations.  History reviewed. No pertinent past medical history.  History reviewed. No pertinent surgical history.  Social History  has no history on file for tobacco use, alcohol use, and drug use.  Not on File  History reviewed. No pertinent family history.   Prior to  Admission medications   Medication Sig Start Date End Date Taking? Authorizing Provider  apixaban (ELIQUIS) 5 MG TABS tablet Take by mouth. 06/19/21  Yes [provider]  Cholecalciferol 25 MCG (1000 UT) tablet Take by mouth. 08/18/21 09/17/21 Yes [provider]  ferrous sulfate  325 (65 FE) MG tablet Take by mouth. 08/18/21 09/17/21 Yes [provider]  folic acid (FOLVITE) 1 MG tablet Take by mouth. 06/19/21 09/17/21 Yes [provider]  LORazepam (ATIVAN) 0.5 MG tablet Take 0.5 mg by mouth every 6 (six) hours as needed for anxiety or sedation.   Yes [provider]  metoprolol tartrate (LOPRESSOR) 25 MG tablet Take by mouth. 06/19/21 09/17/21 Yes [provider]  morphine (MSIR) 15 MG tablet Take 15 mg by mouth every 6 (six) hours as needed for severe pain.   Yes [provider]  pantoprazole (PROTONIX) 40 MG tablet Take by mouth. 08/18/21 09/17/21 Yes [provider]  spironolactone (ALDACTONE) 25 MG tablet Take by mouth. 06/19/21 09/17/21 Yes [provider]  thiamine 100 MG tablet Take by mouth. 06/19/21 09/17/21 Yes [provider]  torsemide (DEMADEX) 10 MG tablet Take by mouth. 08/18/21 09/17/21 Yes [provider]    Physical Exam: Vitals:   08/19/21 0000 08/19/21 0015 08/19/21 0030 08/19/21 0130  BP: 99/69 108/62 (!) 93/53 (!) 90/55  Pulse: 76 82 76 75  Resp: 16 17 16 16   Temp:      TempSrc:      SpO2: 97% 94% 98% 96%  Weight:      Height:        Constitutional: NAD, calm, comfortable Vitals:   08/19/21 0000 08/19/21 0015 08/19/21 0030 08/19/21 0130  BP: 99/69 108/62 (!) 93/53 (!) 90/55  Pulse: 76 82 76 75  Resp: 16 17 16 16   Temp:      TempSrc:      SpO2: 97% 94% 98% 96%  Weight:      Height:       General: WDWN, Alert and oriented x3.  Eyes: EOMI, PERRL, conjunctivae normal.  Sclera nonicteric HENT:  Theodore/AT, external ears normal.  Nares patent without epistasis.  Mucous membranes are moist.  Neck: Soft, normal range of motion, supple, no masses, no thyromegaly.  Trachea midline Respiratory: clear to auscultation bilaterally, no wheezing, no crackles. Normal respiratory effort. No accessory muscle use.  Cardiovascular: Irregularly irregular rhythm with normal rate.  3/6 systolic murmur. No rubs / gallops. No extremity edema. 2+ pedal pulses Abdomen: Soft, no tenderness, nondistended, no rebound or guarding.  No masses palpated. Has hepatomegaly. Bowel sounds normoactive Musculoskeletal: FROM. no cyanosis. No joint deformity upper and lower extremities. Normal muscle tone.  Skin: Warm, dry, intact no rashes, lesions, ulcers. No induration Neurologic: CN 2-12 grossly intact.  Normal speech.  Sensation intact, Strength 5/5 in all extremities.   Psychiatric: Normal judgment and insight.  Normal mood.    Labs on Admission: I have personally reviewed following labs and imaging studies  CBC: Recent Labs  Lab 08/18/21 1733  WBC 4.6  NEUTROABS 2.7  HGB 13.0  HCT 41.2  MCV 95.4  PLT [id number]    Basic Metabolic Panel: Recent Labs  Lab 08/18/21 1733  NA 135  K 4.0  CL 99  CO2 24  GLUCOSE 92  BUN 18  CREATININE 2.00*  CALCIUM 10.9*    GFR: Estimated Creatinine Clearance: 23.9 mL/min (A) (by C-G formula based on SCr of 2 mg/dL (H)).  Liver Function Tests: No results for  input(s): AST, ALT, ALKPHOS, BILITOT, PROT, ALBUMIN in the last 168 hours.  Urine analysis: No results found for: COLORURINE, APPEARANCEUR, LABSPEC, PHURINE, GLUCOSEU, HGBUR, BILIRUBINUR, KETONESUR, PROTEINUR, UROBILINOGEN, NITRITE, LEUKOCYTESUR  Radiological Exams on Admission: DG Chest Port 1 View  Result Date: 08/18/2021 CLINICAL DATA:  Shortness of breath EXAM: PORTABLE CHEST 1 VIEW COMPARISON:  None. FINDINGS: Enlarged cardiac silhouette. Trace left pleural effusion. No focal airspace consolidation. Right midlung scarring. No pneumothorax. No acute osseous abnormality. IMPRESSION: Cardiomegaly.  Trace left pleural effusion. Electronically Signed   By: Caprice Renshaw M.D.   On: 08/18/2021 18:10    EKG: Independently reviewed.  EKG shows atrial fibrillation with nonspecific ST changes but no acute ST elevation or depression.  QTc mildly prolonged at  480  Assessment/Plan Principal Problem:   AKI (acute kidney injury) Ms. Laneve was given IV fluids in the emergency room.  We will continue with gentle IV fluid hydration with LR at 50 ml/hr Recheck renal function and electrolytes in morning with labs  Active Problems:   Hypotension Initial systolic blood pressure in the 70-80 range.  Systolic blood pressure is now 90-105 Continue to monitor    Atrial fibrillation, chronic Continue Eliquis and metoprolol    Chronic systolic CHF (congestive heart failure)  Is on metoprolol, spironolactone, Demadex at home.  Demadex is held as patient was volume depleted with hypotension when she initially arrived.  Will resume Demadex once stabilized    Alcoholic cirrhosis  Chronic.  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  Has an IVC filter.  Was noncompliant with coagulation and was found to have pulmonary embolism a few days ago when hospitalized in [facility].  Eliquis was resumed    Chronic anticoagulation   Noncompliance     DVT prophylaxis: Is on Eliquis  Code Status:   Full Code  Family Communication:  Diagnosis and plan discussed with patient.  Verbalized understanding agrees with plan.  Further recommendation follow as clinical indicated Disposition Plan:   Patient is from:  Arrived from all rehab and homeless shelter  Anticipated DC to:  To be determined  Anticipated DC date:  Anticipate 1 to 2-day stay  Admission status:  Observation   Claudean Severance Quinlan Vollmer MD [facility]  How to contact the [facility] Attending or Consulting provider 7A - 7P or covering provider during after hours 7P -7A, for this patient?   Check the care team in [facility] and look for a) attending/consulting [facility] provider listed and b) the [facility] team  listed Log into [email or web address] and use [place]'s universal password to access. If you do not have the password, please contact the hospital operator. Locate the [facility] provider you are looking for under [facility] and page to a number that you can be directly reached. If you still have difficulty reaching the provider, please page the [facility] (Director on Call) for the Hospitalists listed on amion for assistance.  08/19/2021, 3:35 AM

## 2021-08-19 NOTE — Progress Notes (Addendum)
Patient admitted early this morning, detail please see HPI, she was sent to ED from homeless shelter by EMS due to complaint of weakness, found to be hypotensive, AKI, report had not had alcohol in over a week, complaining of alcohol withdrawal, per chart review she was hospitalized at [facility] from 11/3to 11/10 then from 11/11 to 12/2, then from 12/ 7 to 12/ 16 for fluid overload was diuresed had paracentesis, she was discharged from [facility] 12/16 to homeless shelter but she was rejected, Apparently when she got there, she said that the center said that she was not allowed to be there because she had a walker.  The driver who took her there drove her to a homeless shelter in [place] but they did not have a bed available.  She was feeling generally weak and was brought here for further evaluation. She currently feels like she needs to go to an assisted living facility.  60 y.o. female with a PMH of EtOH cirrhosis, polysubstance use,  chronic HFrEF, pulmonary hypertension, severe TR, pAF, recurrent PE s/p IVC filter on Eliquis, chronic resp failure on 3L prn at [facility], and MDD ,she is currently on room air, 02 sats at 97%  She is aaox3, NAD, lung clear, ab nontender, + bs, reports ab stayed down after paracentesis from prior hospitalization, no lower extremity edema  AKI, reports had n/v/poor oral intake for the last few days, per careeverywhere, her baseline cr is around 1, cr is 2.34 today Hold diuretics, continue gentle hydration, avoid hypotension , decrease lopressor with holding parameters, repeat bmp in am  Add on UA and UDS ( ED RN made aware to collect urine sample) and alcohol level, doubt she is in alcohol withdrawal since she has been in the hospital most of the days in the last two months  Chronic tremors Reports on chronic ativan, I advised her to avoid ativan with h/o cirrhosis, she insists on taking it , reports she has been taking for years she wants to take it  daily as needed  Depression,feel better now, denies si/hi, f/u with psychiatry  FTT, use a walker for the last 61yrs

## 2021-08-19 NOTE — Progress Notes (Signed)
ED RN asked about urine sample not collected.  Patient came to unit, This RN gave instructions for urine collection. Pt verbalized understanding.

## 2021-08-19 NOTE — Progress Notes (Signed)
Patient declined skin assessment. Patient belongings include home medications still in pharmacy bag, this RN explained about medication and hospital policy, patient refuse for home meds to be sent to pharmacy. Will notify appropriate management.

## 2021-08-19 NOTE — Progress Notes (Signed)
HOSPITAL MEDICINE OVERNIGHT EVENT NOTE    Nursing reports that patient is requesting medication "for my withdrawals."  Nursing cannot verify that patient is exhibiting any objective evidence of alcohol withdrawal at this time and does not see the patient actually exhibiting any tremors.  Considering history of alcohol abuse, we will go ahead and initiate CIWA protocol without and a scheduled Ativan taper.  If patient happens to score high enough CIWA score then a PRN benzodiazepine can be administered otherwise we will continue to monitor.  Marinda Elk  MD [facility]

## 2021-08-20 DIAGNOSIS — Z888 Allergy status to other drugs, medicaments and biological substances status: Secondary | ICD-10-CM | POA: Diagnosis not present

## 2021-08-20 DIAGNOSIS — Z91199 Patient's noncompliance with other medical treatment and regimen due to unspecified reason: Secondary | ICD-10-CM | POA: Diagnosis not present

## 2021-08-20 DIAGNOSIS — J9611 Chronic respiratory failure with hypoxia: Secondary | ICD-10-CM | POA: Diagnosis present

## 2021-08-20 DIAGNOSIS — I952 Hypotension due to drugs: Secondary | ICD-10-CM | POA: Diagnosis not present

## 2021-08-20 DIAGNOSIS — I5022 Chronic systolic (congestive) heart failure: Secondary | ICD-10-CM | POA: Diagnosis present

## 2021-08-20 DIAGNOSIS — Z20822 Contact with and (suspected) exposure to covid-19: Secondary | ICD-10-CM | POA: Diagnosis present

## 2021-08-20 DIAGNOSIS — E440 Moderate protein-calorie malnutrition: Secondary | ICD-10-CM | POA: Diagnosis present

## 2021-08-20 DIAGNOSIS — F332 Major depressive disorder, recurrent severe without psychotic features: Secondary | ICD-10-CM | POA: Diagnosis present

## 2021-08-20 DIAGNOSIS — F4310 Post-traumatic stress disorder, unspecified: Secondary | ICD-10-CM | POA: Diagnosis present

## 2021-08-20 DIAGNOSIS — F1010 Alcohol abuse, uncomplicated: Secondary | ICD-10-CM | POA: Diagnosis not present

## 2021-08-20 DIAGNOSIS — Z5901 Sheltered homelessness: Secondary | ICD-10-CM | POA: Diagnosis not present

## 2021-08-20 DIAGNOSIS — Z681 Body mass index (BMI) 19 or less, adult: Secondary | ICD-10-CM | POA: Diagnosis not present

## 2021-08-20 DIAGNOSIS — I959 Hypotension, unspecified: Secondary | ICD-10-CM | POA: Diagnosis present

## 2021-08-20 DIAGNOSIS — Z86711 Personal history of pulmonary embolism: Secondary | ICD-10-CM | POA: Diagnosis not present

## 2021-08-20 DIAGNOSIS — Z7901 Long term (current) use of anticoagulants: Secondary | ICD-10-CM | POA: Diagnosis not present

## 2021-08-20 DIAGNOSIS — I482 Chronic atrial fibrillation, unspecified: Secondary | ICD-10-CM | POA: Diagnosis not present

## 2021-08-20 DIAGNOSIS — I4821 Permanent atrial fibrillation: Secondary | ICD-10-CM | POA: Diagnosis present

## 2021-08-20 DIAGNOSIS — K7031 Alcoholic cirrhosis of liver with ascites: Secondary | ICD-10-CM | POA: Diagnosis present

## 2021-08-20 DIAGNOSIS — R45851 Suicidal ideations: Secondary | ICD-10-CM | POA: Diagnosis present

## 2021-08-20 DIAGNOSIS — E8809 Other disorders of plasma-protein metabolism, not elsewhere classified: Secondary | ICD-10-CM | POA: Diagnosis present

## 2021-08-20 DIAGNOSIS — I071 Rheumatic tricuspid insufficiency: Secondary | ICD-10-CM | POA: Diagnosis present

## 2021-08-20 DIAGNOSIS — Z95828 Presence of other vascular implants and grafts: Secondary | ICD-10-CM | POA: Diagnosis not present

## 2021-08-20 DIAGNOSIS — H547 Unspecified visual loss: Secondary | ICD-10-CM | POA: Diagnosis present

## 2021-08-20 DIAGNOSIS — R0789 Other chest pain: Secondary | ICD-10-CM | POA: Diagnosis present

## 2021-08-20 DIAGNOSIS — Z86718 Personal history of other venous thrombosis and embolism: Secondary | ICD-10-CM | POA: Diagnosis not present

## 2021-08-20 DIAGNOSIS — I110 Hypertensive heart disease with heart failure: Secondary | ICD-10-CM | POA: Diagnosis present

## 2021-08-20 DIAGNOSIS — N179 Acute kidney failure, unspecified: Secondary | ICD-10-CM | POA: Diagnosis present

## 2021-08-20 DIAGNOSIS — D72819 Decreased white blood cell count, unspecified: Secondary | ICD-10-CM | POA: Diagnosis present

## 2021-08-20 DIAGNOSIS — Z885 Allergy status to narcotic agent status: Secondary | ICD-10-CM | POA: Diagnosis not present

## 2021-08-20 LAB — BASIC METABOLIC PANEL
Anion gap: 9 (ref 5–15)
BUN: 25 mg/dL — ABNORMAL HIGH (ref 6–20)
CO2: 25 mmol/L (ref 22–32)
Calcium: 10.4 mg/dL — ABNORMAL HIGH (ref 8.9–10.3)
Chloride: 103 mmol/L (ref 98–111)
Creatinine, Ser: 1.81 mg/dL — ABNORMAL HIGH (ref 0.44–1.00)
GFR, Estimated: 32 mL/min — ABNORMAL LOW (ref >60)
Glucose, Bld: 83 mg/dL (ref 70–99)
Potassium: 3.6 mmol/L (ref 3.5–5.1)
Sodium: 137 mmol/L (ref 135–145)

## 2021-08-20 LAB — MAGNESIUM: Magnesium: 1.7 mg/dL (ref 1.7–2.4)

## 2021-08-20 MED ORDER — DIPHENHYDRAMINE HCL 50 MG/ML IJ SOLN
12.5000 mg | Freq: Three times a day (TID) | INTRAMUSCULAR | Status: DC | PRN
  Administered 2021-08-20 – 2021-08-21 (×5): 12.5 mg via INTRAVENOUS
  Filled 2021-08-20 (×5): qty 1

## 2021-08-20 MED ORDER — SODIUM CHLORIDE 0.9 % IV SOLN: INTRAVENOUS | Status: AC

## 2021-08-20 NOTE — Progress Notes (Signed)
Patient was crying when RN entered room. When asked what was wrong she stated that she "had a lot going on in my head. There is too much coming at me all at one time. It's too much to deal with. It think it would be better if I just commit suicide. If I just jumped out the window and ended it right now." RN sat down to listen to what the patient was feeling. Patient stated that she has attempted suicide in the past and has been "committed to a mental institution" before. Patient stated several times that life would be better if she was "just gone". Patient states that she has no body in her life to help her and it's "too much too handle" all on her own. Dr. Jacqulyn Bath and Charge Nurse notified. 1:1 sitter ordered. All belongings were removed from patients room and all suicide precautions were implemented.

## 2021-08-20 NOTE — Progress Notes (Signed)
Patient was very tearful and angry during her morning medication pass.When asked about what was wrong the patient stated that "that doctor came in here getting up in my face and disrespecting me. He's lucky my son wasn't here because he would have taken him downstairs and knocked him out. My son would never stand for someone disrespecting me like he did." The patient continued to be upset over her encounter with the physician and her stay at her last "rehabilitation place". RN listened to patient and attempted to de-escalate. Patient was able to calm down and was resting calmly in bed at the end of our discussion.

## 2021-08-20 NOTE — Progress Notes (Addendum)
PROGRESS NOTE    Adrienne Villarreal  [id number] DOB: 1961-03-13 DOA: 08/18/2021 PCP: Pcp, No   Brief Narrative:  HPI: Adrienne Villarreal is a 60 y.o. female with medical history significant for prior alcohol abuse, alcoholic cirrhosis, CHF, atrial fibrillation and PEs on Eliquis, anxiety. Has not had alcohol in over a week. Was admitted from Dec. 8-16 at [facility] for cirrhosis with ascites, SOB. She had a paracentesis and was diuresed while there.  She is also found to have pulmonary embolism that she had not been compliant with her anticoagulation and was restarted on Eliquis.  She reports she did have some chest pain while in the hospital require any intervention.  She was discharged to an alcohol rehab center on the morning of December 16 but when she got to the center she was told they could not take her as she has a walker that she ambulates with.  She was then driven to a homeless shelter but they did not have any beds available for her and was sent to the emergency room for evaluation of generalized weakness.  In the emergency room she was found to have a low blood pressure with systolic pressure in the [id number] range.  She complained of some ongoing chest pain and discomfort that did not radiate.  She has chronic shortness of breath with exertion no shortness of breath when at rest.  She has not had any nausea or vomiting.  She states she has not had any alcohol in over a week and does not feel tremulous.  She denies tobacco use or illicit drug use.  She is currently homeless and unable to care for herself.   ED Course: She was given IV fluids in the emergency room and her blood pressure improved with systolic pressure in the [id number] range.  Opponent was 15.  She was found to have mild AKI with a creatinine of 2.  According to our records her baseline creatinine is 1.  Electrolytes are normal.  CBC is unremarkable.  COVID swab is pending.  Hospitalist service asked to observe patient  overnight and monitor blood pressure and have social work assist with rehab placement in the morning  Assessment & Plan:   Principal Problem:   AKI (acute kidney injury) ([place]) Active Problems:   Atrial fibrillation, chronic (HCC)   Hypotension   Alcoholic cirrhosis (HCC)   Prolonged QT interval   Chronic anticoagulation   Noncompliance   Pulmonary embolism (HCC)   Chronic systolic CHF (congestive heart failure) ([place])  AKI (acute kidney injury): Baseline creatinine at 1.  Presented with 2.0.  Peaked at 2.34.  Improving at 1.81.  Continue IV fluids and repeat labs in the morning.   Hypotension: Still low blood pressure but no symptoms.  We will discontinue Aldactone and torsemide.  She is already on reduced dose of Lopressor which I will continue.   Atrial fibrillation, permanent: Continue Eliquis and metoprolol   Chronic systolic CHF (congestive heart failure)  Is on metoprolol, spironolactone, Demadex at home.  Demadex and Aldactone on hold for reasons mentioned above.  She is euvolemic.    Chronic alcoholic liver cirrhosis: Patient underwent paracentesis at her recent hospitalization at [facility].    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  Has an IVC filter.  Was noncompliant with coagulation and was found to have pulmonary embolism a few days ago when hospitalized  in [facility].  Eliquis was resumed     Chronic anticoagulation   Noncompliance  Placement: Apparently she has refused to work with PT OT.  I have notified social worker to help deal with her situation.   DVT prophylaxis:    Code Status: Full Code  Family Communication:  None present at bedside.  Plan of care discussed with patient in length and he verbalized understanding and agreed with it.  Status is: Inpatient  Remains inpatient appropriate because: Needs IV  fluids.  Estimated body mass index is 19.49 kg/m as calculated from the following:   Height as of this encounter: 5\' 3"  (1.6 m).   Weight as of this encounter: 49.9 kg.  Nutritional Assessment: Body mass index is 19.49 kg/m.Marland Kitchen Seen by dietician.  I agree with the assessment and plan as outlined below: Nutrition Status:  Skin Assessment: I have examined the patient's skin and I agree with the wound assessment as performed by the wound care RN as outlined below:    Consultants:  None  Procedures:  None  Antimicrobials:  Anti-infectives (From admission, onward)    None          Subjective: Patient seen and examined.  Patient half-asleep, did not want to wake up to have conversation, did not want to respond to questions unless asked 3 times and when asked 3 times, she got annoyed.  Did not make any eye contact.  I explained to her the plan and that she might potentially get discharged tomorrow once her AKI resolves.  She tells me that she has nowhere to go.  I asked her to start making phone calls to her friends and family to see who is going to let her stay with them.  Objective: Vitals:   08/20/21 0141 08/20/21 0401 08/20/21 0516 08/20/21 1025  BP: 99/70 91/68 96/68  96/60  Pulse: 73 67 67 69  Resp: 18 18  17   Temp: 98 F (36.7 C) 98.7 F (37.1 C)  98.2 F (36.8 C)  TempSrc: Oral Oral    SpO2: 92% 95%  97%  Weight:      Height:        Intake/Output Summary (Last 24 hours) at 08/20/2021 1120 Last data filed at 08/20/2021 0800 Gross per 24 hour  Intake 1555.92 ml  Output 0 ml  Net 1555.92 ml   Filed Weights   08/18/21 1711  Weight: 49.9 kg    Examination:  General exam: Appears calm and comfortable  Respiratory system: Clear to auscultation. Respiratory effort normal. Cardiovascular system: S1 & S2 heard, RRR. No JVD, murmurs, rubs, gallops or clicks. No pedal edema. Gastrointestinal system: Abdomen is nondistended, soft and nontender. No organomegaly or  masses felt. Normal bowel sounds heard. Central nervous system: Alert and oriented. No focal neurological deficits. Extremities: Symmetric 5 x 5 power. Skin: No rashes, lesions or ulcers Psychiatry: Judgement and insight appear poor. Mood & affect flat   Data Reviewed: I have personally reviewed following labs and imaging studies  CBC: Recent Labs  Lab 08/18/21 1733 08/19/21 0639  WBC 4.6 3.9*  NEUTROABS 2.7  --   HGB 13.0 11.4*  HCT 41.2 36.9  MCV 95.4 96.6  PLT 283 [id number]   Basic Metabolic Panel: Recent Labs  Lab 08/18/21 1733 08/19/21 0639 08/20/21 0605  NA 135 138 137  K 4.0 3.6 3.6  CL 99 103 103  CO2 24 25 25   GLUCOSE 92 97 83  BUN 18 23* 25*  CREATININE 2.00* 2.34*  1.81*  CALCIUM 10.9* 10.4* 10.4*  MG  --   --  1.7   GFR: Estimated Creatinine Clearance: 26.4 mL/min (A) (by C-G formula based on SCr of 1.81 mg/dL (H)). Liver Function Tests: No results for input(s): AST, ALT, ALKPHOS, BILITOT, PROT, ALBUMIN in the last 168 hours. No results for input(s): LIPASE, AMYLASE in the last 168 hours. No results for input(s): AMMONIA in the last 168 hours. Coagulation Profile: No results for input(s): INR, PROTIME in the last 168 hours. Cardiac Enzymes: No results for input(s): CKTOTAL, CKMB, CKMBINDEX, TROPONINI in the last 168 hours. BNP (last 3 results) No results for input(s): PROBNP in the last 8760 hours. HbA1C: No results for input(s): HGBA1C in the last 72 hours. CBG: Recent Labs  Lab 08/19/21 0507  GLUCAP 95   Lipid Profile: No results for input(s): CHOL, HDL, LDLCALC, TRIG, CHOLHDL, LDLDIRECT in the last 72 hours. Thyroid Function Tests: No results for input(s): TSH, T4TOTAL, FREET4, T3FREE, THYROIDAB in the last 72 hours. Anemia Panel: No results for input(s): VITAMINB12, FOLATE, FERRITIN, TIBC, IRON, RETICCTPCT in the last 72 hours. Sepsis Labs: No results for input(s): PROCALCITON, LATICACIDVEN in the last 168 hours.  Recent Results (from the  past 240 hour(s))  Resp Panel by RT-PCR (Flu A&B, Covid) Nasopharyngeal Swab     Status: None   Collection Time: 08/19/21  2:57 AM   Specimen: Nasopharyngeal Swab; Nasopharyngeal(NP) swabs in vial transport medium  Result Value Ref Range Status   SARS Coronavirus 2 by RT PCR NEGATIVE NEGATIVE Final    Comment: (NOTE) SARS-CoV-2 target nucleic acids are NOT DETECTED.  The SARS-CoV-2 RNA is generally detectable in upper respiratory specimens during the acute phase of infection. The lowest concentration of SARS-CoV-2 viral copies this assay can detect is 138 copies/mL. A negative result does not preclude SARS-Cov-2 infection and should not be used as the sole basis for treatment or other patient management decisions. A negative result may occur with  improper specimen collection/handling, submission of specimen other than nasopharyngeal swab, presence of viral mutation(s) within the areas targeted by this assay, and inadequate number of viral copies(<138 copies/mL). A negative result must be combined with clinical observations, patient history, and epidemiological information. The expected result is Negative.  Fact Sheet for Patients:  EntrepreneurPulse.com.au  Fact Sheet for Healthcare Providers:  IncredibleEmployment.be  This test is no t yet approved or cleared by the [place] FDA and  has been authorized for detection and/or diagnosis of SARS-CoV-2 by FDA under an Emergency Use Authorization (EUA). This EUA will remain  in effect (meaning this test can be used) for the duration of the COVID-19 declaration under Section 564(b)(1) of the Act, 21 U.S.C.section 360bbb-3(b)(1), unless the authorization is terminated  or revoked sooner.       Influenza A by PCR NEGATIVE NEGATIVE Final   Influenza B by PCR NEGATIVE NEGATIVE Final    Comment: (NOTE) The Xpert Xpress SARS-CoV-2/FLU/RSV plus assay is intended as an aid in the diagnosis of  influenza from Nasopharyngeal swab specimens and should not be used as a sole basis for treatment. Nasal washings and aspirates are unacceptable for Xpert Xpress SARS-CoV-2/FLU/RSV testing.  Fact Sheet for Patients: EntrepreneurPulse.com.au  Fact Sheet for Healthcare Providers: IncredibleEmployment.be  This test is not yet approved or cleared by the [place] FDA and has been authorized for detection and/or diagnosis of SARS-CoV-2 by FDA under an Emergency Use Authorization (EUA). This EUA will remain in effect (meaning this test can be used) for the  duration of the COVID-19 declaration under Section 564(b)(1) of the Act, 21 U.S.C. section 360bbb-3(b)(1), unless the authorization is terminated or revoked.  Performed at [facility], [address] [place], [place] [address]       Radiology Studies: DG Chest Port 1 View  Result Date: 08/18/2021 CLINICAL DATA:  Shortness of breath EXAM: PORTABLE CHEST 1 VIEW COMPARISON:  None. FINDINGS: Enlarged cardiac silhouette. Trace left pleural effusion. No focal airspace consolidation. Right midlung scarring. No pneumothorax. No acute osseous abnormality. IMPRESSION: Cardiomegaly.  Trace left pleural effusion. Electronically Signed   By: Caprice Renshaw M.D.   On: 08/18/2021 18:10    Scheduled Meds:  apixaban  5 mg Oral BID   ferrous sulfate  325 mg Oral Q breakfast   folic acid  1 mg Oral Daily   lactulose  10 g Oral BID   metoprolol tartrate  12.5 mg Oral BID   multivitamin with minerals  1 tablet Oral Daily   pantoprazole  20 mg Oral Daily   thiamine  100 mg Oral Daily   Continuous Infusions:  sodium chloride 75 mL/hr at 08/20/21 1045     LOS: 0 days   Time spent: 35 minutes   Hughie Closs, MD [facility]  08/20/2021, 11:20 AM  Please page via Loretha Stapler and do not message via secure chat for anything urgent. Secure chat can be used for anything non urgent.  How to contact the  [facility] Attending or Consulting provider 7A - 7P or covering provider during after hours 7P -7A, for this patient?  Check the care team in [facility] and look for a) attending/consulting TRH provider listed and b) the [facility] team listed. Page or secure chat 7A-7P. Log into [email or web address] and use 's universal password to access. If you do not have the password, please contact the hospital operator. Locate the [facility] provider you are looking for under [facility] and page to a number that you can be directly reached. If you still have difficulty reaching the provider, please page the [facility] (Director on Call) for the Hospitalists listed on amion for assistance.

## 2021-08-20 NOTE — Progress Notes (Signed)
Received call from CCMD,patient had a 10 beat non sustained VTach. Patient asleep when assessed,denies any chest pain at this time. VSS. MD,Gardner notified via secure chat. Lab order received in epic. Will continue to monitor.

## 2021-08-20 NOTE — Progress Notes (Signed)
OT Cancellation Note  Patient Details Name: Eulalie Speights MRN: [id number] DOB: June 26, 1961   Cancelled Treatment:    Reason Eval/Treat Not Completed: Other (comment) (Nsg reports pt making suicidal threats. Will return later date)  [facility] Cristin Szatkowski, OT/L   Acute OT Clinical Specialist Acute Rehabilitation Services Pager [phone] Office [phone]  08/20/2021, 1:25 PM

## 2021-08-20 NOTE — TOC Initial Note (Addendum)
Transition of Care [facility]) - Initial/Assessment Note    Patient Details  Name: Adrienne Villarreal MRN: [id number] Date of Birth: [birth year]  [facility] CM/SW Contact:    Carmina Miller, LCSWA Phone Number: 08/20/2021, 11:38 AM  Clinical Narrative:                 CSW spoke with patient by phone. Pt initially upset and didn't want to speak. Pt states that she would like to a rehab facility for her alcohol issues. Pt states she went to [facility] but was turned away for having a walker. Pt stated she didn't think it was fair to not be able to go just because she has mobility issues. Pt states she just needs a little bit of help getting back on her feet. Pt went on to say that the interaction she had with the MD was hostile and she would see to it that the MD was reprimanded for the way he spoke to her. CSW inquired on why pt refused to work with OT, pt stated she hadn't had her medicine or breakfast and that she told them to come back at 12. CSW advised to pt that the hospital typically is not able to place in ALF due to the amount of time it may take.   CSW offered housing resources and advised pt to call and see if there were any available beds, pt stated she "wasn't going to call all over the place to try and find a bed". CSW advised the pt that she would have to assist in the search. Pt stated she would see. Pt kept stating that she couldn't be out in the cold and has to have somewhere to go. When CSW inquired about family supports, pt stated her mother had passed and she had no one.    CSW notes patient is homeless and unfortunately at this time there is significant barriers to securing housing in the medical setting. CSW has provided patient with information on resources to follow-up with in the community once they discharge. CSW notes patient arrived to the hospital with no current residence and at this time they are medically cleared they are appropriate to discharge back to their current  living situation.         Patient Goals and CMS Choice        Expected Discharge Plan and Services                                                Prior Living Arrangements/Services                       Activities of Daily Living Home Assistive Devices/Equipment: Other (Comment) (walker) ADL Screening (condition at time of admission) Patient's cognitive ability adequate to safely complete daily activities?: No Is the patient deaf or have difficulty hearing?: No Does the patient have difficulty seeing, even when wearing glasses/contacts?: No Does the patient have difficulty concentrating, remembering, or making decisions?: No Patient able to express need for assistance with ADLs?: No Does the patient have difficulty dressing or bathing?: No Independently performs ADLs?: Yes (appropriate for developmental age) Does the patient have difficulty walking or climbing stairs?: Yes Weakness of Legs: None Weakness of Arms/Hands: None  Permission Sought/Granted  Emotional Assessment              Admission diagnosis:  AKI (acute kidney injury) (HCC) [N17.9] Hypotension, unspecified hypotension type [I95.9] Patient Active Problem List   Diagnosis Date Noted   AKI (acute kidney injury) (HCC) 08/19/2021   Atrial fibrillation, chronic (HCC) 08/19/2021   Hypotension 08/19/2021   Alcoholic cirrhosis (HCC) 08/19/2021   Prolonged QT interval 08/19/2021   Chronic anticoagulation 08/19/2021   Noncompliance 08/19/2021   Pulmonary embolism (HCC) 08/19/2021   Chronic systolic CHF (congestive heart failure) (HCC) 08/19/2021   PCP:  Pcp, No Pharmacy:  No Pharmacies Listed    Social Determinants of Health (SDOH) Interventions    Readmission Risk Interventions No flowsheet data found.

## 2021-08-20 NOTE — Evaluation (Signed)
Physical Therapy Evaluation and Discharge Patient Details Name: Adrienne Villarreal MRN: [id number] DOB: 08/06/1961 Today's Date: 08/20/2021  History of Present Illness  Pt is a 59 y.o. F who presents 08/18/2021 for generalized weakness. Recent admission 12/8-16 at [facility] for cirrhosis with ascites, SOB and had paracentesis. Found to have PE and had not been compliant with anticoagulation. Discharged 12/16 to alcohol rehab center, but they reported they could not take her with a walker. She was then driven to a homeless shelter, but they did not have any beds available. Signifiacnt PMH: atrial fibrillation, alcoholic cirrhosis, PE, CHF.  Clinical Impression  PTA, pt has housing insecurity and ambulates with a Rollator. Upon PT evaluation, pt appears to be close to her functional baseline. She is upset and emotionally labile describing a conversation with MD earlier, however, was agreeable to assessment. Pt ambulating hallway distances with a Rollator at a modI level. HR 98-102 bpm. Able to perform ADL's I.e. lower body dressing without physical difficulty. No further acute PT needed. Thank you for this consult.       Recommendations for follow up therapy are one component of a multi-disciplinary discharge planning process, led by the attending physician.  Recommendations may be updated based on patient status, additional functional criteria and insurance authorization.  Follow Up Recommendations No PT follow up    Assistance Recommended at Discharge PRN  Functional Status Assessment Patient has not had a recent decline in their functional status  Equipment Recommendations  None recommended by PT    Recommendations for Other Services       Precautions / Restrictions Precautions Precautions: None Restrictions Weight Bearing Restrictions: No      Mobility  Bed Mobility Overal bed mobility: Independent                  Transfers Overall transfer level: Modified  independent Equipment used: Rollator (4 wheels)                    Ambulation/Gait Ambulation/Gait assistance: Modified independent (Device/Increase time) Gait Distance (Feet): 200 Feet Assistive device: Rolling walker (2 wheels) Gait Pattern/deviations: Step-through pattern;Decreased stride length Gait velocity: mildly decreased     General Gait Details: no gross imbalance noted  Stairs            Wheelchair Mobility    Modified Rankin (Stroke Patients Only)       Balance Overall balance assessment: Mild deficits observed, not formally tested                                           Pertinent Vitals/Pain Pain Assessment: Faces Faces Pain Scale: Hurts a little bit Pain Location: LLE Pain Descriptors / Indicators: Discomfort Pain Intervention(s): Monitored during session    Home Living Family/patient expects to be discharged to:: Shelter/Homeless                        Prior Function Prior Level of Function : Independent/Modified Independent             Mobility Comments: uses rollator       Hand Dominance        Extremity/Trunk Assessment   Upper Extremity Assessment Upper Extremity Assessment: Defer to OT evaluation    Lower Extremity Assessment Lower Extremity Assessment: Overall WFL for tasks assessed    Cervical / Trunk Assessment Cervical / Trunk  Assessment: Normal  Communication   Communication: No difficulties  Cognition Arousal/Alertness: Awake/alert Behavior During Therapy: WFL for tasks assessed/performed Overall Cognitive Status: No family/caregiver present to determine baseline cognitive functioning                                 General Comments: pt tangential, emotionally labile when discussing conversation with MD, reports she wants to go to mental health center        General Comments      Exercises     Assessment/Plan    PT Assessment Patient does not need any  further PT services  PT Problem List         PT Treatment Interventions      PT Goals (Current goals can be found in the Care Plan section)  Acute Rehab PT Goals Patient Stated Goal: go to mental health facility PT Goal Formulation: All assessment and education complete, DC therapy    Frequency     Barriers to discharge        Co-evaluation               AM-PAC PT "6 Clicks" Mobility  Outcome Measure Help needed turning from your back to your side while in a flat bed without using bedrails?: None Help needed moving from lying on your back to sitting on the side of a flat bed without using bedrails?: None Help needed moving to and from a bed to a chair (including a wheelchair)?: None Help needed standing up from a chair using your arms (e.g., wheelchair or bedside chair)?: None Help needed to walk in hospital room?: None Help needed climbing 3-5 steps with a railing? : A Little 6 Click Score: 23    End of Session   Activity Tolerance: Patient tolerated treatment well Patient left: in bed;with call bell/phone within reach;with bed alarm set Nurse Communication: Mobility status PT Visit Diagnosis: Muscle weakness (generalized) (M62.81)    Time: 1213-1229 PT Time Calculation (min) (ACUTE ONLY): 16 min   Charges:   PT Evaluation $PT Eval Low Complexity: 1 Low          Adrienne Villarreal, PT, DPT Acute Rehabilitation Services Pager [phone] Office [phone]   Adrienne Villarreal 08/20/2021, 3:36 PM

## 2021-08-20 NOTE — Progress Notes (Signed)
OT Cancellation Note  Patient Details Name: Adrienne Villarreal MRN: [id number] DOB: 02-14-1961   Cancelled Treatment:    Reason Eval/Treat Not Completed: Other (comment) Pt adamantly refusing. Will attempt later time.  Thornell Mule, OT/L   Acute OT Clinical Specialist Acute Rehabilitation Services Pager [phone] Office [phone]  08/20/2021, 10:24 AM

## 2021-08-21 DIAGNOSIS — F332 Major depressive disorder, recurrent severe without psychotic features: Secondary | ICD-10-CM | POA: Diagnosis not present

## 2021-08-21 DIAGNOSIS — I2699 Other pulmonary embolism without acute cor pulmonale: Secondary | ICD-10-CM

## 2021-08-21 DIAGNOSIS — F4310 Post-traumatic stress disorder, unspecified: Secondary | ICD-10-CM

## 2021-08-21 DIAGNOSIS — Z7901 Long term (current) use of anticoagulants: Secondary | ICD-10-CM

## 2021-08-21 DIAGNOSIS — I482 Chronic atrial fibrillation, unspecified: Secondary | ICD-10-CM

## 2021-08-21 DIAGNOSIS — I5022 Chronic systolic (congestive) heart failure: Secondary | ICD-10-CM

## 2021-08-21 DIAGNOSIS — K7030 Alcoholic cirrhosis of liver without ascites: Secondary | ICD-10-CM

## 2021-08-21 DIAGNOSIS — I959 Hypotension, unspecified: Secondary | ICD-10-CM

## 2021-08-21 LAB — BASIC METABOLIC PANEL
Anion gap: 8 (ref 5–15)
BUN: 24 mg/dL — ABNORMAL HIGH (ref 6–20)
CO2: 22 mmol/L (ref 22–32)
Calcium: 10.1 mg/dL (ref 8.9–10.3)
Chloride: 105 mmol/L (ref 98–111)
Creatinine, Ser: 1.33 mg/dL — ABNORMAL HIGH (ref 0.44–1.00)
GFR, Estimated: 46 mL/min — ABNORMAL LOW (ref >60)
Glucose, Bld: 91 mg/dL (ref 70–99)
Potassium: 3.7 mmol/L (ref 3.5–5.1)
Sodium: 135 mmol/L (ref 135–145)

## 2021-08-21 MED ORDER — TRAMADOL HCL 50 MG PO TABS
50.0000 mg | ORAL_TABLET | Freq: Four times a day (QID) | ORAL | Status: DC | PRN
  Administered 2021-08-22 – 2021-08-23 (×3): 50 mg via ORAL
  Filled 2021-08-21 (×7): qty 1

## 2021-08-21 MED ORDER — LATANOPROST 0.005 % OP SOLN
1.0000 [drp] | Freq: Every day | OPHTHALMIC | Status: DC
  Administered 2021-08-21 – 2021-09-05 (×14): 1 [drp] via OPHTHALMIC
  Filled 2021-08-21 (×2): qty 2.5

## 2021-08-21 MED ORDER — TRAZODONE HCL 50 MG PO TABS
25.0000 mg | ORAL_TABLET | Freq: Every evening | ORAL | Status: DC | PRN
  Administered 2021-08-23: 25 mg via ORAL
  Filled 2021-08-21 (×4): qty 1

## 2021-08-21 MED ORDER — FLUOXETINE HCL 10 MG PO CAPS
10.0000 mg | ORAL_CAPSULE | Freq: Every day | ORAL | Status: DC
  Administered 2021-08-24 – 2021-08-25 (×2): 10 mg via ORAL
  Filled 2021-08-21 (×11): qty 1

## 2021-08-21 MED ORDER — LIDOCAINE 5 % EX PTCH
1.0000 | MEDICATED_PATCH | CUTANEOUS | Status: DC
Start: 2021-08-21 — End: 2021-08-27
  Administered 2021-08-23: 1 via TRANSDERMAL
  Filled 2021-08-21 (×5): qty 1

## 2021-08-21 MED ORDER — ENSURE ENLIVE PO LIQD
237.0000 mL | Freq: Two times a day (BID) | ORAL | Status: DC
  Administered 2021-08-24 – 2021-09-06 (×20): 237 mL via ORAL

## 2021-08-21 NOTE — Progress Notes (Signed)
Patient is agitated. Also complains of  8/10 neck, chest and abdominal pain that she says is chronic. Unable to assess quality of the pain as patient is also complaining about her diet order. When asked to describe the pain she stated," Ma'am I just told you I'm in pain. I am sitting here hurting and starving." She feels like the heart healthy diet does not include food that she can eat. Per NT she spoke with the kitchen multiple times and refuses all substitutions.

## 2021-08-21 NOTE — Progress Notes (Signed)
Patient ID: Adrienne Villarreal, female   DOB: August 28, 1961, 60 y.o.   MRN: [id number]  PROGRESS NOTE    Adrienne Villarreal  LZJ:[id number] DOB: 1960-11-15 DOA: 08/18/2021 PCP: Pcp, No   Brief Narrative:  60 year old female with history of alcohol abuse, alcoholic cirrhosis, chronic systolic CHF, atrial fibrillation and PEs on Eliquis, anxiety, homelessness, recent admission from 08/10/2021-08/18/2021 at [facility] for cirrhosis with ascites requiring paracentesis and diuresis and was also found to have pulmonary embolism for which she was restarted on Eliquis (apparently she had been noncompliant with her anticoagulation).  She was discharged to alcohol rehab center on 08/18/2021 but because she was ambulating with a walker, she could not get into the center and she was driven to a homeless shelter; it did not have any beds and patient was sent to emergency room.  On presentation, she was hypotensive with systolic blood pressure in the 70s to 80s and patient complained of ongoing chest pain.  She received IV fluids; creatinine was 2; creatinine was 1.21 on discharge on 08/18/2021.  Assessment & Plan:   Acute kidney injury -Presented with creatinine of 2; baseline creatinine around 1; creatinine was 1.1 on discharge on 08/18/2021 from [facility].  Creatinine had worsened to 2.34 during this hospitalization. -Treated with IV fluids.  Improving.  Creatinine 1.33 today.  Encourage oral intake.  DC IV fluids.  Currently not on any diuretics.  Hypotension -Blood pressure on the lower side.  Continue low-dose of metoprolol.  Diuretics on hold.  Chronic systolic CHF Intermittent chest pain -Patient continues to complain of intermittent chest pain: Unclear if this is pain medication seeking behavior or cardiac cause of chest pain. -Currently no signs of volume overload.  Continue low-dose of metoprolol.  Consult cardiology.  Follow recommendations.  Strict input output.  Daily weights.   Fluid restriction. 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  Has an IVC filter.  Was noncompliant with anticoagulation and was found to have pulmonary embolism a few days ago when hospitalized in [facility].  Eliquis was resumed  Suicidal ideation -Patient apparently expressed suicidal ideation on 08/20/2021.  Continue suicide precautions and one-to-one sitter.  Psychiatry evaluation pending.  Homelessness -Child psychotherapist following.  Patient apparently refused to work with PT/OT intermittently.  DVT prophylaxis: Eliquis Code Status: Full Family Communication: None at bedside Disposition Plan: Status is: Inpatient  Remains inpatient appropriate because: Unsafe discharge plan.  Need for psychiatry/cardiology evaluation  Consultants: Psychiatry/cardiology  Procedures: None  Antimicrobials: None   Subjective: Patient seen and examined at bedside.  Poor historian.  Denies current chest pain.  No worsening abdominal pain, nausea or vomiting reported.  Sitter present at bedside.  Objective: Vitals:   08/20/21 1700 08/20/21 2100 08/21/21 0518 08/21/21 0942  BP: 99/70 102/74 105/75 93/65  Pulse: 71 73 65 70  Resp: 15 18 18 16   Temp: 98.4 F (36.9 C) 98.7 F (37.1 C) 98.3 F (36.8 C) 99.4 F (37.4 C)  TempSrc: Oral Oral Oral Oral  SpO2: 98% 95% 100% 92%  Weight:   49.8 kg   Height:        Intake/Output Summary (Last 24 hours) at 08/21/2021 1149 Last data filed at 08/21/2021 0807 Gross per  24 hour  Intake 1321.78 ml  Output 0 ml  Net 1321.78 ml   Filed Weights   08/18/21 1711 08/21/21  0518  Weight: 49.9 kg 49.8 kg    Examination:  General exam: Appears calm and comfortable.  Currently on room air.  Looks chronically ill and deconditioned. Respiratory system: Bilateral decreased breath sounds at bases Cardiovascular system: S1 & S2 heard, Rate controlled Gastrointestinal system: Abdomen is slightly distended; soft and nontender. Normal bowel sounds heard. Extremities: No cyanosis, clubbing; trace lower extremity edema Central nervous system: Awake, slow to respond, extremely poor historian.  No focal neurological deficits. Moving extremities Skin: No rashes, lesions or ulcers Psychiatry: Affect is mostly flat.  Hardly participates in any conversation.   Data Reviewed: I have personally reviewed following labs and imaging studies  CBC: Recent Labs  Lab 08/18/21 1733 08/19/21 0639  WBC 4.6 3.9*  NEUTROABS 2.7  --   HGB 13.0 11.4*  HCT 41.2 36.9  MCV 95.4 96.6  PLT 283 253   Basic Metabolic Panel: Recent Labs  Lab 08/18/21 1733 08/19/21 0639 08/20/21 0605 08/21/21 0453  NA 135 138 137 135  K 4.0 3.6 3.6 3.7  CL 99 103 103 105  CO2 24 25 25 22   GLUCOSE 92 97 83 91  BUN 18 23* 25* 24*  CREATININE 2.00* 2.34* 1.81* 1.33*  CALCIUM 10.9* 10.4* 10.4* 10.1  MG  --   --  1.7  --    GFR: Estimated Creatinine Clearance: 35.8 mL/min (A) (by C-G formula based on SCr of 1.33 mg/dL (H)). Liver Function Tests: No results for input(s): AST, ALT, ALKPHOS, BILITOT, PROT, ALBUMIN in the last 168 hours. No results for input(s): LIPASE, AMYLASE in the last 168 hours. No results for input(s): AMMONIA in the last 168 hours. Coagulation Profile: No results for input(s): INR, PROTIME in the last 168 hours. Cardiac Enzymes: No results for input(s): CKTOTAL, CKMB, CKMBINDEX, TROPONINI in the last 168 hours. BNP (last 3 results) No results for input(s): PROBNP in the last 8760 hours. HbA1C: No results for input(s): HGBA1C in the last 72 hours. CBG: Recent Labs  Lab  08/19/21 0507  GLUCAP 95   Lipid Profile: No results for input(s): CHOL, HDL, LDLCALC, TRIG, CHOLHDL, LDLDIRECT in the last 72 hours. Thyroid Function Tests: No results for input(s): TSH, T4TOTAL, FREET4, T3FREE, THYROIDAB in the last 72 hours. Anemia Panel: No results for input(s): VITAMINB12, FOLATE, FERRITIN, TIBC, IRON, RETICCTPCT in the last 72 hours. Sepsis Labs: No results for input(s): PROCALCITON, LATICACIDVEN in the last 168 hours.  Recent Results (from the past 240 hour(s))  Resp Panel by RT-PCR (Flu A&B, Covid) Nasopharyngeal Swab     Status: None   Collection Time: 08/19/21  2:57 AM   Specimen: Nasopharyngeal Swab; Nasopharyngeal(NP) swabs in vial transport medium  Result Value Ref Range Status   SARS Coronavirus 2 by RT PCR NEGATIVE NEGATIVE Final    Comment: (NOTE) SARS-CoV-2 target nucleic acids are NOT DETECTED.  The SARS-CoV-2 RNA is generally detectable in upper respiratory specimens during the acute phase of infection. The lowest concentration of SARS-CoV-2 viral copies this assay can detect is 138 copies/mL. A negative result does not preclude SARS-Cov-2 infection and should not be used as the sole basis for treatment or other patient management decisions. A negative result may occur with  improper specimen collection/handling, submission of specimen other than nasopharyngeal swab, presence of viral mutation(s) within the areas targeted by this assay, and inadequate number of viral copies(<138 copies/mL).  A negative result must be combined with clinical observations, patient history, and epidemiological information. The expected result is Negative.  Fact Sheet for Patients:  EntrepreneurPulse.com.au  Fact Sheet for Healthcare Providers:  IncredibleEmployment.be  This test is no t yet approved or cleared by the [place] FDA and  has been authorized for detection and/or diagnosis of SARS-CoV-2 by FDA under an  Emergency Use Authorization (EUA). This EUA will remain  in effect (meaning this test can be used) for the duration of the COVID-19 declaration under Section 564(b)(1) of the Act, 21 U.S.C.section 360bbb-3(b)(1), unless the authorization is terminated  or revoked sooner.       Influenza A by PCR NEGATIVE NEGATIVE Final   Influenza B by PCR NEGATIVE NEGATIVE Final    Comment: (NOTE) The Xpert Xpress SARS-CoV-2/FLU/RSV plus assay is intended as an aid in the diagnosis of influenza from Nasopharyngeal swab specimens and should not be used as a sole basis for treatment. Nasal washings and aspirates are unacceptable for Xpert Xpress SARS-CoV-2/FLU/RSV testing.  Fact Sheet for Patients: EntrepreneurPulse.com.au  Fact Sheet for Healthcare Providers: IncredibleEmployment.be  This test is not yet approved or cleared by the [place] FDA and has been authorized for detection and/or diagnosis of SARS-CoV-2 by FDA under an Emergency Use Authorization (EUA). This EUA will remain in effect (meaning this test can be used) for the duration of the COVID-19 declaration under Section 564(b)(1) of the Act, 21 U.S.C. section 360bbb-3(b)(1), unless the authorization is terminated or revoked.  Performed at [facility], [place] [address] [place], [place] [address]          Radiology Studies: No results found.      Scheduled Meds:  apixaban  5 mg Oral BID   ferrous sulfate  325 mg Oral Q breakfast   folic acid  1 mg Oral Daily   lactulose  10 g Oral BID   latanoprost  1 drop Both Eyes QHS   metoprolol tartrate  12.5 mg Oral BID   multivitamin with minerals  1 tablet Oral Daily   pantoprazole  20 mg Oral Daily   thiamine  100 mg Oral Daily   Continuous Infusions:        Aline August, MD [facility] 08/21/2021, 11:49 AM

## 2021-08-21 NOTE — Progress Notes (Signed)
Nurse Secretary did bring a "portable cell" phone to room for Sitters to use and to allow patient to order her meals. Patient was very appreciative of being able to call to [facility] [employer] and place her order independently. It helped her to have some control back of her choices in her care. She remains very frustrated with limited options, and repetition of the few things she does like lessons her appetite. She says "she can't have nothing with any salt or flavor so what's the point." The representative did speak with patient about five minutes in total, going over options and alternatives for which the patient was grateful. She was able to order more 'sweets' without going over her limits but says this is still ultimately unsatisfactory and she still feels very hungry. She is tired of eating cheeseburgers! Which is one of the more appealing options. Patient if a very good cook at home and misses the freedom of being able to prepare her own meal in ways that she has learned that align with her Doctors orders. She would like to eat more, and is just struggling with having an appetite for the food she is allowed based on her dietary restrictions. Please continue to provide a phone for patient and remind her to order her meals, as this made it much easier for her to make her selections and be willing to eat when her tray does arrive. It also helps for both Dietary rep and patient to be able to go over options without a third person relaying messages. Adrienne Villarreal is very smart, nice, and seems well informed about her healthcare. Allowing her to be in "charge" of how her day goes would probably benefit her, she may be more willing to participate in care if it can sometimes be on her terms.
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
## 2021-08-21 NOTE — Progress Notes (Signed)
Lidocain patch and tramadol were offered to the patient. She refused both, she reports she has tried both in the past but they do not work. Also states, " I take medicine at home, I take my little piece of morphine and that is what helps my pain."

## 2021-08-21 NOTE — Progress Notes (Signed)
OT Cancellation Note  Patient Details Name: Zyara Riling MRN: [id number] DOB: June 10, 1961   Cancelled Treatment:    Reason Eval/Treat Not Completed: Other (comment) (Declined therapy at this time stating her L foot hurts and she doesnt want to move. Will return as schedule allows.)  Susane Bey M Soloman Mckeithan Passion Lavin MSOT, OTR/L Acute Rehab Pager: [phone] Office: [phone] 08/21/2021, 9:35 AM

## 2021-08-21 NOTE — Consult Note (Addendum)
Cardiology Consultation:   Patient ID: Adrienne Villarreal MRN: [id number]; DOB: Feb 14, 1961  Admit date: 08/18/2021 Date of Consult: 08/21/2021  PCP:  Merryl Hacker, No   [facility] Providers Cardiologist:  None   {   Patient Profile:   Adrienne Villarreal is a 60 y.o. female with a hx of alcohol abuse, alcoholic cirrhosis, chronic systolic CHF, atrial fibrillation and PEs on Eliquis, anxiety, homelessness who is being seen 08/21/2021 for the evaluation of chest pain at the request of Dr. Starla Link.  History of Present Illness:   [name] has several recent ED visits and hospitalizations for unspecified chest pain, pulmonary embolism, alcohol abuse, psychiatric complaint, and suicidal ideation. She presented to the [facility] on 12/16 complaining of chest pain.   Patient was most recently hospitalized at [facility] from 12/7-12/16. During that hospitalization, patient was found to be fluid overloaded and hypoxic. HSTN were 14>>12>>14. Chest X-ray with marked stable cardiomegaly, nonspecific bibasilar opacities, possible trace left pleural effusion. Nuclear medicine lung scan findings were consistent with a high probability study for pulmonary embolism. Echo on 12/8 showed LVEF 40-45%, severely dilated RV/RA, severe TR (no change from previous echocardiogram on 11/12. EKG without ST elevation. Patient had previously been diagnosed with a PE on 07/14/2021 and was started on eliquis, which she was not taking. Patient was counseled on medication compliance, diuresed, and was discharged with plans to attend residential treatment for her alcohol use. When patient arrived at the rehab center, she was told that she could not stay there because she needs a walker to ambulate. Patient was then taken to a homeless shelter in [place], but they did not have a bed available. Patient reported feeling weak and having some ongoing chest pain, and was taken to the [facility] ED.   In the [facility] ED,  patient reported having chest wall pain. Pain was left sided and was similar to what she experienced while at [facility]. Denies SOB. Reports continuing to drink alcohol, but had not had anything to drink since leaving [facility] earlier in the day. EKG showed atrial fibrillation with a rate of 88 BPM. Creatinine 2.00, eGFR 28, hemoglobin 13.0, WBC 4.6. HSTN 15 and flat. Viral respiratory panel negative. CXR showed cardiomegaly and a trace left pleural effusion.   At the time of cardiology consult, patient reports that she has been having pain off and on for years. Pain is located on the left side of her chest, under her left arm, and up her neck. Describes the pain as soreness and it comes and goes randomly. Is not associated with exertion and is not relieved with rest. Sometimes pain is relieved with the use of a heating blanket. Patient reports occasional palpitations, SOB, and dizziness when standing up to fast. Patient reported having a cath at [facility] several years ago that did not show any blockages, however unable to locate any records.    History reviewed. No pertinent past medical history.  History reviewed. No pertinent surgical history.   Home Medications:  Prior to Admission medications   Medication Sig Start Date End Date Taking? Authorizing Provider  apixaban (ELIQUIS) 5 MG TABS tablet Take by mouth. 06/19/21  Yes [provider]  Cholecalciferol 25 MCG (1000 UT) tablet Take by mouth. 08/18/21 09/17/21 Yes [provider]  ferrous sulfate 325 (65 FE) MG tablet Take by mouth. 08/18/21 09/17/21 Yes [provider]  folic acid (FOLVITE) 1 MG tablet Take by mouth. 06/19/21 09/17/21 Yes [provider]  LORazepam (ATIVAN)  0.5 MG tablet Take 0.5 mg by mouth every 6 (six) hours as needed for anxiety or sedation.   Yes [provider]  metoprolol tartrate (LOPRESSOR) 25 MG tablet Take by mouth. 06/19/21 09/17/21 Yes [provider]  morphine (MSIR) 15 MG  tablet Take 15 mg by mouth every 6 (six) hours as needed for severe pain.   Yes [provider]  pantoprazole (PROTONIX) 40 MG tablet Take by mouth. 08/18/21 09/17/21 Yes [provider]  spironolactone (ALDACTONE) 25 MG tablet Take by mouth. 06/19/21 09/17/21 Yes [provider]  thiamine 100 MG tablet Take by mouth. 06/19/21 09/17/21 Yes [provider]  torsemide (DEMADEX) 10 MG tablet Take by mouth. 08/18/21 09/17/21 Yes [provider]    Inpatient Medications: Scheduled Meds:  apixaban  5 mg Oral BID   ferrous sulfate  325 mg Oral Q breakfast   folic acid  1 mg Oral Daily   lactulose  10 g Oral BID   latanoprost  1 drop Both Eyes QHS   metoprolol tartrate  12.5 mg Oral BID   multivitamin with minerals  1 tablet Oral Daily   pantoprazole  20 mg Oral Daily   thiamine  100 mg Oral Daily   Continuous Infusions:  PRN Meds: acetaminophen, diphenhydrAMINE, LORazepam, senna-docusate  Allergies:   Not on File  Social History:   Social History   Socioeconomic History   Marital status: Single    Spouse name: Not on file   Number of children: Not on file   Years of education: Not on file   Highest education level: Not on file  Occupational History   Not on file  Tobacco Use   Smoking status: Not on file   Smokeless tobacco: Not on file  Substance and Sexual Activity   Alcohol use: Not on file   Drug use: Not on file   Sexual activity: Not on file  Other Topics Concern   Not on file  Social History Narrative   Not on file   Social Determinants of Health   Financial Resource Strain: Not on file  Food Insecurity: Not on file  Transportation Needs: Not on file  Physical Activity: Not on file  Stress: Not on file  Social Connections: Not on file  Intimate Partner Violence: Not on file    Family History:   History reviewed. No pertinent family history.   ROS:  Please see the history of present illness.  All other ROS  reviewed and negative.     Physical Exam/Data:   Vitals:   08/20/21 1700 08/20/21 2100 08/21/21 0518 08/21/21 0942  BP: 99/70 102/74 105/75 93/65  Pulse: 71 73 65 70  Resp: 15 18 18 16   Temp: 98.4 F (36.9 C) 98.7 F (37.1 C) 98.3 F (36.8 C) 99.4 F (37.4 C)  TempSrc: Oral Oral Oral Oral  SpO2: 98% 95% 100% 92%  Weight:   49.8 kg   Height:        Intake/Output Summary (Last 24 hours) at 08/21/2021 1336 Last data filed at 08/21/2021 0807 Gross per 24 hour  Intake 1021.78 ml  Output 0 ml  Net 1021.78 ml   Last 3 Weights 08/21/2021 08/18/2021  Weight (lbs) 109 lb 12.6 oz 110 lb  Weight (kg) 49.8 kg 49.896 kg     Body mass index is 19.45 kg/m.  General:  Well nourished, well developed HEENT: normal Neck: no JVD Vascular: Distal radial and dorsalis pedis pulses 2+ bilaterally Cardiac:  normal S1, S2  no murmur, irregular. Left chest wall tender to palpation   Lungs:  clear to auscultation bilaterally, no wheezing, rhonchi or rales.   Abd: soft, nontender, mildly distended  Ext: no edema Musculoskeletal:  No deformities Skin: warm and dry  Neuro:  CNs 2-12 intact, no focal abnormalities noted Psych:  Normal affect   EKG:  The EKG was personally reviewed and demonstrates:  Atrial fibrillation, possible atypical RBBB Telemetry:  Telemetry was personally reviewed and demonstrates:  Atrial fibrillation, occasional PVCs   Relevant CV Studies:  Echo 07/15/2021  Left Ventricle: There is flattening of the interventricular septum  during systole which is suggestive of pressure and volume overload    Left Ventricle: EF: 40-45%.    Right Ventricle: Abnormal tricuspid annular plane systolic excursion  (TAPSE) <1.7 cm.    Right Ventricle: Dilated.    Right Atrium: Right atrium is severely dilated.    Tricuspid Valve: There is severe regurgitation.    Tricuspid Valve: The right ventricular systolic pressure is moderately  elevated (50-59 mmHg).  Echo 08/10/2021  Probably no  significant change compared to the  complete echocardiogram performed less than 1 month ago.   Laboratory Data:  High Sensitivity Troponin:   Recent Labs  Lab 08/18/21 1733 08/19/21 0639 08/19/21 0643  TROPONINIHS 15 15 15      Chemistry Recent Labs  Lab 08/19/21 0639 08/20/21 0605 08/21/21 0453  NA 138 137 135  K 3.6 3.6 3.7  CL 103 103 105  CO2 25 25 22   GLUCOSE 97 83 91  BUN 23* 25* 24*  CREATININE 2.34* 1.81* 1.33*  CALCIUM 10.4* 10.4* 10.1  MG  --  1.7  --   GFRNONAA 23* 32* 46*  ANIONGAP 10 9 8     No results for input(s): PROT, ALBUMIN, AST, ALT, ALKPHOS, BILITOT in the last 168 hours. Lipids No results for input(s): CHOL, TRIG, HDL, LABVLDL, LDLCALC, CHOLHDL in the last 168 hours.  Hematology Recent Labs  Lab 08/18/21 1733 08/19/21 0639  WBC 4.6 3.9*  RBC 4.32 3.82*  HGB 13.0 11.4*  HCT 41.2 36.9  MCV 95.4 96.6  MCH 30.1 29.8  MCHC 31.6 30.9  RDW 16.2* 16.4*  PLT 283 253   Thyroid No results for input(s): TSH, FREET4 in the last 168 hours.  BNPNo results for input(s): BNP, PROBNP in the last 168 hours.  DDimer No results for input(s): DDIMER in the last 168 hours.   Radiology/Studies:  DG Chest Port 1 View  Result Date: 08/18/2021 CLINICAL DATA:  Shortness of breath EXAM: PORTABLE CHEST 1 VIEW COMPARISON:  None. FINDINGS: Enlarged cardiac silhouette. Trace left pleural effusion. No focal airspace consolidation. Right midlung scarring. No pneumothorax. No acute osseous abnormality. IMPRESSION: Cardiomegaly.  Trace left pleural effusion. Electronically Signed   By: Maurine Simmering M.D.   On: 08/18/2021 18:10     Assessment and Plan:  Shekera Beavers is a 60 y.o. female with a hx of alcohol abuse, alcoholic cirrhosis, chronic systolic CHF, atrial fibrillation and PEs on Eliquis, anxiety, homelessness who is being seen 08/21/2021 for the evaluation of chest pain at the request of Dr. Starla Link.  Unspecified Chest Pain: Patient presented to the ED complaining  of chest wall pain. HSTN 15 and flat in the ED. EKG does not show obvious signs of ischemia. Pain possibly related to diagnosis of PE a few days ago while at [facility]. Pain is not associated with exertion, not relieved by rest, and is described as soreness. Relieved with the use of a  heating pad. Associated chest wall tenderness. Likely musculoskeletal in nature.   - Patient reports being unable to take OTC pain medications due to her blood thinner and alcoholic cirrhosis - Consider treatment for musculoskeletal chest wall tenderness     Chronic systolic CHF: Most recent Echo 12/8 showed LVEF 40-45%, severely dilated RV/RA, severe TR (no change from previous echocardiogram on 11/12.) Patient does not show signs of volume overload  - Continue low-dose metoprolol  - Diuretics currently on hold due to soft blood pressures. Plan to resume demadex and aldactone when BP stabilizes - Continue strict I/Os, daily weights, fluid restrictions   Atrial Fibrillation - Continue metoprolol and eliquis   Pulmonary Embolism: Recently diagnosed and treated for PE on 07/14/2021 at [facility]. Nuclear medicine lung scan findings on 08/09/2021 were consistent with a high probability study for pulmonary embolism. 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 Patient is noncompliant with eliquis  - Eliquis resumed, patient educated on the importance of taking this medication   Acute Kidney Injury: Creatinine 2.0>>2.34>>1.81>>1.33  -Improving, continue to avoid nephrotoxic agents  Chronic decompensated alcoholic cirrhosis with ascites: Recently underwent paracentesis during hospitalization at [facility].  - Outpatient follow up with PCP/GI  Homelessness: Social worker following    Risk Assessment/Risk Scores:   New York Heart Association (NYHA) Functional Class NYHA Class II  CHA2DS2-VASc Score = 3  This indicates a 3.2% annual risk of stroke. The patient's score is based upon: CHF  History: 1 HTN History: 0 Diabetes History: 0 Stroke History: 0 Vascular Disease History: 1 Age Score: 0 Gender Score: 1    For questions or updates, please contact [place] Please consult [email or web address] for contact info under    Signed, Margie Billet, PA-C  08/21/2021 1:36 PM   Agree with note by Vikki Ports, PA-C.  Ms. Samad was admitted on Friday with atypical chest pain.  She has a history of alcoholic cirrhosis, chronic systolic heart failure, pulmonary emboli on Eliquis status post IVC filter implantation who was hospitalized at [facility] with volume overload for 1 week.  She was discharged home with the intent of going to rehab however she was admitted here with atypical chest pain.  The pain does not sound ischemically mediated.  She says that she is had a heart cath at [facility] in the past that showed no evidence of CAD.  Her enzymes are little flat.  Her EKG shows no acute changes although she did have newly recognized atrial fibrillation already on Eliquis..  Her exam is benign.  I do not think we need to pursue an ischemic work-up.  She will need case Freight forwarder and social worker to work on medications, living conditions and nutrition.  Lorretta Harp, M.D., [place], [facility], Laverta [place] Corrigan [address]. [place], [place]  [id number]  [phone] 08/21/2021 3:36 PM

## 2021-08-21 NOTE — Progress Notes (Signed)
This NT/Sitter, first day to sit with this patient. Upon entering room and introducing myself and my role, patient did immediately begin to complain about the care she has been receiving from all staff members. Lots of complaints about [place] doing nothing, sitting around, taking our time with her request, or not responding appropriately. I did listen to all complaints, as she continued about the food, staff members etc. This was all in the first twenty minutes or so of my shift. Patient appears to be sleeping continuously after eating breakfast. However, I believe she is resting, but still mostly awake and hearing seeing most of what is happening around her. I think she "sleeps" and says she it tired to avoid/minimize having to speak or interact with all staff members. She has been easily arousable the entire morning, not seeming to be waking up from a true sleep when different team members have come in to assess her. In spite of her many complaints about the lack of care she has received, she has refused all care from all team members so far that I have seen. She has reluctantly allowed most to at minimum complete her assessment. However from the Medical Doctor, Occupational Therapist, Dietician, Psychiatric Doctor, and myself, she has refused all offers of care. She has stated that she is too tired and would be more open later today or maybe tomorrow. I did offer her a bath and to change her linen and she said "I don't feel like doing all that" or "I don't need to". A member from the Dietary team did also visit to help patient order lunch, and this lady did try very hard to get patient to order some food and gave her many suggestions and offers that were within her (heart healthy) diet. She again refused, said she wanted nothing but did "settle" for a cheeseburger only. If she asked for something that was not on her diet, and was offered an alternative, she would not accept any alternative options. Significant as she  was unhappy because she currently does not have a phone (SI precautions) to order her meals herself. She was more alert and active at the start of my shift, was laughing at her television show and seemed to warm up to me a little bit. I can see that she is very scared, and feels lonely because of her health conditions and lack of housing currently. Maybe some hopelessness as well from feeling a loss of control of her circumstances. I hope to establish a connection and there is potential for patient to allow [place] to help her more, with the right approach. Not making much eye contact either with me or others. She did allow for [place] to check her Vital Signs, and asked for Ativan after her morning medications. Thus far, that has been the only care she was willing and able to receive today.

## 2021-08-21 NOTE — Progress Notes (Signed)
Initial Nutrition Assessment  DOCUMENTATION CODES:   Non-severe (moderate) malnutrition in context of chronic illness  INTERVENTION:  -Recommend liberalizing diet to regular to optimize PO intake -Ensure Enlive po BID, each supplement provides 350 kcal and 20 grams of protein -MVI with minerals daily  NUTRITION DIAGNOSIS:   Moderate Malnutrition related to chronic illness (EtOH abuse, cirrhosis, CHF) as evidenced by mild fat depletion, moderate fat depletion, mild muscle depletion, moderate muscle depletion, severe muscle depletion.  GOAL:   Patient will meet greater than or equal to 90% of their needs  MONITOR:   PO intake, Supplement acceptance, Weight trends, Labs, I & O's  REASON FOR ASSESSMENT:   Malnutrition Screening Tool    ASSESSMENT:   60 year old female with history of alcohol abuse, alcoholic cirrhosis, chronic systolic CHF, atrial fibrillation and PEs on Eliquis, anxiety, SI, homelessness, recent admission from 08/10/2021-08/18/2021 at [facility] for cirrhosis with ascites requiring paracentesis and diuresis and was also found to have pulmonary embolism for which she was restarted on Eliquis (apparently she had been noncompliant with her anticoagulation).  She was discharged to alcohol rehab center on 08/18/2021. Admitted now with AKI and hypotension  Pt laying in curled position at time of RD visit and provided no responses to RD questions. Pt did allow RD to perform limited nutrition-focused physical exam. RD discussed pt with safety sitter who reports pt consumed 75% of breakfast today but was reluctant to order lunch due to dietary limitations as a result of heart healthy diet. Will request diet liberalization from MD in hopes of improving intake.   No weight history available for review.   PO Intake: 50-75% x 5 recorded meals (60% avg meal intake)   Medications:  apixaban  5 mg Oral BID   [START ON 08/22/2021] feeding supplement  237 mL Oral BID BM    ferrous sulfate  325 mg Oral Q breakfast   folic acid  1 mg Oral Daily   lactulose  10 g Oral BID   latanoprost  1 drop Both Eyes QHS   metoprolol tartrate  12.5 mg Oral BID   multivitamin with minerals  1 tablet Oral Daily   pantoprazole  20 mg Oral Daily   thiamine  100 mg Oral Daily   Labs: Recent Labs  Lab 08/19/21 0639 08/20/21 0605 08/21/21 0453  NA 138 137 135  K 3.6 3.6 3.7  CL 103 103 105  CO2 25 25 22   BUN 23* 25* 24*  CREATININE 2.34* 1.81* 1.33*  CALCIUM 10.4* 10.4* 10.1  MG  --  1.7  --   GLUCOSE 97 83 91   Pt meets criteria for moderate malnutrition; however, suspect malnutrition is more severe in nature. Unable to diagnose severe malnutrition at this time without detailed diet/weight history. Will attempt to obtain at follow-up.  NUTRITION - FOCUSED PHYSICAL EXAM:  Flowsheet Row Most Recent Value  Orbital Region Mild depletion  Upper Arm Region Moderate depletion  Thoracic and Lumbar Region Mild depletion  Buccal Region Mild depletion  Temple Region Moderate depletion  Clavicle Bone Region Severe depletion  Clavicle and Acromion Bone Region Severe depletion  Scapular Bone Region Moderate depletion  Dorsal Hand Mild depletion  Patellar Region Moderate depletion  Anterior Thigh Region Moderate depletion  Posterior Calf Region Moderate depletion  Edema (RD Assessment) None  Hair Unable to assess  Eyes Unable to assess  Mouth Unable to assess  Skin Other (Comment)  [dry]  Nails Reviewed       Diet  Order:   Diet Order             Diet Heart Room service appropriate? Yes; Fluid consistency: Thin; Fluid restriction: 1500 mL Fluid  Diet effective now                   EDUCATION NEEDS:   Not appropriate for education at this time  Skin:  Skin Assessment: Reviewed RN Assessment  Last BM:  12/18  Height:   Ht Readings from Last 1 Encounters:  08/18/21 5\' 3"  (1.6 m)    Weight:  Wt Readings from Last 10 Encounters:  08/21/21 49.8  kg   BMI:  Body mass index is 19.45 kg/m.  Estimated Nutritional Needs:   Kcal:  1550-1750  Protein:  75-90 grams  Fluid:  1.5L     08/23/21., MS, RD, LDN (she/her/hers) RD pager number and weekend/on-call pager number located in Amion.

## 2021-08-22 DIAGNOSIS — R0789 Other chest pain: Secondary | ICD-10-CM

## 2021-08-22 DIAGNOSIS — F332 Major depressive disorder, recurrent severe without psychotic features: Secondary | ICD-10-CM | POA: Diagnosis not present

## 2021-08-22 MED ORDER — LORATADINE 10 MG PO TABS
10.0000 mg | ORAL_TABLET | Freq: Every day | ORAL | Status: DC | PRN
  Filled 2021-08-22: qty 1

## 2021-08-22 MED ORDER — DIPHENHYDRAMINE HCL 25 MG PO CAPS
25.0000 mg | ORAL_CAPSULE | Freq: Every day | ORAL | Status: DC | PRN
  Administered 2021-08-22 – 2021-09-06 (×17): 25 mg via ORAL
  Filled 2021-08-22 (×19): qty 1

## 2021-08-22 MED ORDER — SODIUM CHLORIDE 0.9 % IV SOLN
12.5000 mg | Freq: Once | INTRAVENOUS | Status: AC
  Administered 2021-08-22: 12.5 mg via INTRAVENOUS
  Filled 2021-08-22: qty 12.5
  Filled 2021-08-22: qty 0.5

## 2021-08-22 MED ORDER — ONDANSETRON HCL 4 MG/2ML IJ SOLN
4.0000 mg | Freq: Four times a day (QID) | INTRAMUSCULAR | Status: DC | PRN
  Filled 2021-08-22: qty 2

## 2021-08-22 NOTE — Progress Notes (Signed)
Pt requested medication for pain and nausea for which she was offered tramadol and zofran. Pt refused medications stating that she does not take these medications. This Clinical research associate explained that these are the medications on her list and I would contact the doctor to see if adjustments could be made. Pt was then offered her other medications for the evening which she refused and became agitated. Pt stated " I'm going to have to get out of here". This writer explained to pt. that she would not be able to leave because of IVC. Pt then started to walk out of the room with this Teacher, early years/pre explaining that security would have to be called.       Security was alerted and one of the NT's was able to redirect pt back to her room.        MD was notified and came to talk to pt. In her room.
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
## 2021-08-22 NOTE — Progress Notes (Signed)
Patient ID: Adrienne Villarreal, female   DOB: Jul 08, 1961, 60 y.o.   MRN: [id number]  PROGRESS NOTE    Adrienne Villarreal  XLK:[id number] DOB: Jan 24, 1961 DOA: 08/18/2021 PCP: Pcp, No   Brief Narrative:  60 year old female with history of alcohol abuse, alcoholic cirrhosis, chronic systolic CHF, atrial fibrillation and PEs on Eliquis, anxiety, homelessness, recent admission from 08/10/2021-08/18/2021 at [facility] for cirrhosis with ascites requiring paracentesis and diuresis and was also found to have pulmonary embolism for which she was restarted on Eliquis (apparently she had been noncompliant with her anticoagulation).  She was discharged to alcohol rehab center on 08/18/2021 but because she was ambulating with a walker, she could not get into the center and she was driven to a homeless shelter; it did not have any beds and patient was sent to emergency room.  On presentation, she was hypotensive with systolic blood pressure in the 70s to 80s and patient complained of ongoing chest pain.  She received IV fluids; creatinine was 2; creatinine was 1.21 on discharge on 08/18/2021.  Assessment & Plan:   Acute kidney injury -Presented with creatinine of 2; baseline creatinine around 1; creatinine was 1.1 on discharge on 08/18/2021 from [facility].  Creatinine had worsened to 2.34 during this hospitalization. -Treated with IV fluids.  Improving.  Creatinine 1.33 on 08/21/2021: Pending for today.  Encourage oral intake.  DC'd IV fluids on 08/21/2021.  Currently not on any diuretics.  Hypotension -Blood pressure on the lower side.  Continue low-dose of metoprolol.  Diuretics on hold.  Chronic systolic CHF Intermittent chest pain -Patient continues to complain of intermittent chest pain: Unclear if this is pain medication seeking behavior or cardiac cause of chest pain. -Currently no signs of volume overload.  Continue low-dose of metoprolol.   -Strict input output.  Daily weights.   Fluid restriction. -Spironolactone, Demadex on hold -Cardiology evaluated the patient on 08/21/2021: Cardiology doubts that patient's chest pain is ischemic chest pain and recommend no further intervention.  Diuretics have not been resumed by cardiology yet.  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  Has an IVC filter.  Was noncompliant with anticoagulation and was found to have pulmonary embolism a few days ago when hospitalized in [facility].  Eliquis was resumed  Suicidal ideation -Patient apparently expressed suicidal ideation on 08/20/2021.  Continue suicide precautions and one-to-one sitter.  Psychiatry evaluation appreciated: Recommend to continue current dose of lorazepam as needed.  Patient has been started on fluoxetine 20 mg daily along with trazodone 25 mg at night as needed for sleep.  Psychiatry will follow the patient again today.  Follow recommendations.  Homelessness Noncompliance -Child psychotherapist following.  Patient apparently refused to work with PT/OT intermittently. -Patient intermittently refuses medical care including treatments.  Patient keeps asking for morphine orally; apparently she takes this medication at home.  I reviewed the PDMP and she does not receive morphine consistently; has only received few pills by various providers intermittently recently.  DVT prophylaxis: Eliquis Code Status: Full Family Communication: None at bedside Disposition Plan: Status is: Inpatient  Remains inpatient appropriate because: Unsafe discharge plan.   Consultants: Psychiatry/cardiology  Procedures: None  Antimicrobials:  None   Subjective: Patient seen and examined at bedside.  Poor  historian.  No overnight fever, vomiting reported.  Complains of intermittent chest/back pain/lower extremity pain.  Patient refused Lidoderm patch and tramadol yesterday evening as per nursing staff.  Bedside sitter present at patient's room's door. Objective: Vitals:   08/21/21 0518 08/21/21 0942 08/21/21 2343 08/22/21 0908  BP: 105/75 93/65 110/63 108/73  Pulse: 65 70 83 80  Resp: 18 16 18 16   Temp: 98.3 F (36.8 C) 99.4 F (37.4 C) 98.1 F (36.7 C) 98 F (36.7 C)  TempSrc: Oral Oral Oral Oral  SpO2: 100% 92% 97% 96%  Weight: 49.8 kg     Height:        Intake/Output Summary (Last 24 hours) at 08/22/2021 0950 Last data filed at 08/22/2021 0200 Gross per 24 hour  Intake 440 ml  Output --  Net 440 ml    Filed Weights   08/18/21 1711 08/21/21 0518  Weight: 49.9 kg 49.8 kg    Examination:  General exam: No distress.  On room air currently.  Looks chronically ill and deconditioned. Respiratory system: Decreased breath sounds at bases bilaterally Cardiovascular system: Rate controlled; S1-S2 heard Gastrointestinal system: Abdomen is mildly distended; soft and nontender.  Bowel sounds are heard Extremities: Mild lower extremity edema present; no cyanosis Central nervous system: Alert; extremely slow to respond; extremely poor historian.  No focal neurological deficits.  Moves extremities Skin: No obvious ecchymosis/lesions Psychiatry: Very flat affect.  Does not participate in conversation much.  Data Reviewed: I have personally reviewed following labs and imaging studies  CBC: Recent Labs  Lab 08/18/21 1733 08/19/21 0639  WBC 4.6 3.9*  NEUTROABS 2.7  --   HGB 13.0 11.4*  HCT 41.2 36.9  MCV 95.4 96.6  PLT 283 [id number]    Basic Metabolic Panel: Recent Labs  Lab 08/18/21 1733 08/19/21 0639 08/20/21 0605 08/21/21 0453  NA 135 138 137 135  K 4.0 3.6 3.6 3.7  CL 99 103 103 105  CO2 24 25 25 22    GLUCOSE 92 97 83 91  BUN 18 23* 25* 24*  CREATININE 2.00* 2.34* 1.81* 1.33*  CALCIUM 10.9* 10.4* 10.4* 10.1  MG  --   --  1.7  --     GFR: Estimated Creatinine Clearance: 35.8 mL/min (A) (by C-G formula based on SCr of 1.33 mg/dL (H)). Liver Function Tests: No results for input(s): AST, ALT, ALKPHOS, BILITOT, PROT, ALBUMIN in the last 168 hours. No results for input(s): LIPASE, AMYLASE in the last 168 hours. No results for input(s): AMMONIA in the last 168 hours. Coagulation Profile: No results for input(s): INR, PROTIME in the last 168 hours. Cardiac Enzymes: No results for input(s): CKTOTAL, CKMB, CKMBINDEX, TROPONINI in the last 168 hours. BNP (last 3 results) No results for input(s): PROBNP in the last 8760 hours. HbA1C: No results for input(s): HGBA1C in the last 72 hours. CBG: Recent Labs  Lab 08/19/21 0507  GLUCAP 95    Lipid Profile: No results for input(s): CHOL, HDL, LDLCALC, TRIG, CHOLHDL, LDLDIRECT in the last 72 hours. Thyroid Function Tests: No results for input(s): TSH, T4TOTAL, FREET4, T3FREE, THYROIDAB in the last 72 hours. Anemia Panel: No results for input(s): VITAMINB12, FOLATE, FERRITIN, TIBC, IRON, RETICCTPCT in the last 72 hours. Sepsis Labs: No results for input(s): PROCALCITON, LATICACIDVEN in the last 168 hours.  Recent Results (from the past 240 hour(s))  Resp Panel by RT-PCR (Flu A&B, Covid) Nasopharyngeal Swab     Status: None   Collection Time: 08/19/21  2:57 AM   Specimen: Nasopharyngeal  Swab; Nasopharyngeal(NP) swabs in vial transport medium  Result Value Ref Range Status   SARS Coronavirus 2 by RT PCR NEGATIVE NEGATIVE Final    Comment: (NOTE) SARS-CoV-2 target nucleic acids are NOT DETECTED.  The SARS-CoV-2 RNA is generally detectable in upper respiratory specimens during the acute phase of infection. The lowest concentration of SARS-CoV-2 viral copies this assay can detect is 138 copies/mL. A negative result does not preclude  SARS-Cov-2 infection and should not be used as the sole basis for treatment or other patient management decisions. A negative result may occur with  improper specimen collection/handling, submission of specimen other than nasopharyngeal swab, presence of viral mutation(s) within the areas targeted by this assay, and inadequate number of viral copies(<138 copies/mL). A negative result must be combined with clinical observations, patient history, and epidemiological information. The expected result is Negative.  Fact Sheet for Patients:  EntrepreneurPulse.com.au  Fact Sheet for Healthcare Providers:  IncredibleEmployment.be  This test is no t yet approved or cleared by the [place] FDA and  has been authorized for detection and/or diagnosis of SARS-CoV-2 by FDA under an Emergency Use Authorization (EUA). This EUA will remain  in effect (meaning this test can be used) for the duration of the COVID-19 declaration under Section 564(b)(1) of the Act, 21 U.S.C.section 360bbb-3(b)(1), unless the authorization is terminated  or revoked sooner.       Influenza A by PCR NEGATIVE NEGATIVE Final   Influenza B by PCR NEGATIVE NEGATIVE Final    Comment: (NOTE) The Xpert Xpress SARS-CoV-2/FLU/RSV plus assay is intended as an aid in the diagnosis of influenza from Nasopharyngeal swab specimens and should not be used as a sole basis for treatment. Nasal washings and aspirates are unacceptable for Xpert Xpress SARS-CoV-2/FLU/RSV testing.  Fact Sheet for Patients: EntrepreneurPulse.com.au  Fact Sheet for Healthcare Providers: IncredibleEmployment.be  This test is not yet approved or cleared by the [place] FDA and has been authorized for detection and/or diagnosis of SARS-CoV-2 by FDA under an Emergency Use Authorization (EUA). This EUA will remain in effect (meaning this test can be used) for the duration of  the COVID-19 declaration under Section 564(b)(1) of the Act, 21 U.S.C. section 360bbb-3(b)(1), unless the authorization is terminated or revoked.  Performed at [facility], [place] [address] [place], [place] [address]           Radiology Studies: No results found.      Scheduled Meds:  apixaban  5 mg Oral BID   feeding supplement  237 mL Oral BID BM   ferrous sulfate  325 mg Oral Q breakfast   FLUoxetine  10 mg Oral Daily   folic acid  1 mg Oral Daily   lactulose  10 g Oral BID   latanoprost  1 drop Both Eyes QHS   lidocaine  1 patch Transdermal Q24H   metoprolol tartrate  12.5 mg Oral BID   multivitamin with minerals  1 tablet Oral Daily   pantoprazole  20 mg Oral Daily   thiamine  100 mg Oral Daily   Continuous Infusions:        Aline August, MD [facility] 08/22/2021, 9:50 AM

## 2021-08-22 NOTE — Significant Event (Addendum)
TRH night coverage note:  By way of background: 60 yo female with hx of alcoholic cirrhosis who was discharged from [place] yesterday to a rehab center who then refused to admit her and was taken to homeless shelter but no beds available. Was feeling weak and tired. Found to have hypotension and AKI. BP improved with gentle IVF in ER. Placed in observation.   ON 12/17: did 1 time of morphine for CP.  ON 12/18: ordering home eye drops, pt again asking for pain meds for CP.  Feel she really needs to talk to day team about CP since they were trying to get her off of morphine for the CP at [facility] for the past month.  Really feel this is chronic pain at this point (so did [place]).  Dont feel [place] starting chronic narcotics is appropriate for this.  Psych following.  Cardiology also consulted for chest pain: Doubt ischemic chest pain.  Social worker following for safe disposition plan as patient is homeless.  Psych recommends [facility]. Patient threatening to leave. Now IVC'd.  Pt seen at bedside tonight (12/20) at request of RN.  Security guard [place], [place] was present in room during entire visit and NT Raquel observed from door way.  Pt is agitated, demanding: morphine, phenergan, benadryl, and ativan (all by name).  Regarding morphine: pt has (at her own admission) chronic CP / CP from pulmonary emboli as documented during recent [facility] admission.  [facility] wanted to get pt off of PO morphine.  On 12/17 I ordered a 1 time dose of IV morphine at night and told pt over phone at that time that she needed to discuss further narcotics with day team.  See Dr. Jarome Lamas Progress note today regarding this.  Pt has ongoing concerns for substance abuse (see also psychiatry consult note from earlier today).  I do not feel comfortable ordering further narcotics at this time and explained to patient that she needs to discuss this with day team providers and that I would defer to their decisions in terms of meds and  dosing.  Because pt states she is nauseous and zofran doesn't work for her.  I will order a one time dose of phenergan.  Have explained to patient that beyond that, she needs to discuss meds with day team.  And that I do not plan to order future doses of this med on subsequent nights, beyond what the day attending and consultants decide is appropriate.  Ativan and benadryl: see discussions by Dr. Hanley Ben and Dr. Vanetta Shawl as these already ordered.  Will defer any dose / frequency adjustments to them.

## 2021-08-22 NOTE — Progress Notes (Signed)
OT Cancellation Note  Patient Details Name: Adrienne Villarreal MRN: [id number] DOB: 1960/10/01   Cancelled Treatment:    Reason Eval/Treat Not Completed: Patient declined, no reason specified- pt declined engagement in OT today, reporting she needs to feel better mentally and is having difficulty because her medications have been changed.  Will follow and see as able.   Barry Brunner, OT Acute Rehabilitation Services Pager [phone] Office [phone]   Chancy Milroy 08/22/2021, 10:18 AM

## 2021-08-22 NOTE — Progress Notes (Signed)
Patient ID: Adrienne Villarreal, female   DOB: [birth year], 60 y.o.   MRN: [id number] I was notified by the nursing staff that the patient is threatening to leave the hospital AGAINST MEDICAL ADVICE.  Psychiatry had earlier recommended that patient would require inpatient psychiatric hospitalization.  Discussed with Dr. Hisada/psychiatrist via secure chat and she recommends that patient be IVC'd.  Subsequently, patient has been IVC'd.

## 2021-08-22 NOTE — Progress Notes (Signed)
This is my second day assigned to this patient as Sitter/NT. Patient seems improved slightly from yesterday and is participating a bit more in her care today. She is still very frustrated if she is unable to get exactly what she wants, when she needs it. She did eat more of her breakfast. Dietary rep rounded on patient and she allowed her to assist with ordering meals for her lunch, dinner, and breakfast tomorrow morning.

## 2021-08-22 NOTE — Progress Notes (Signed)
Attempted to repeat a medication history but patient has been very agitated and is finally resting so nursing staff asked me not to disturb.   I updated the PTA med list to match her discharge list from [facility] the same day she presented to [facility].   I also imported multiple allergies from Care Everywhere but am unable to confirm them at this time.   Dr. Hanley Ben has been notified via Secure Chat.

## 2021-08-22 NOTE — TOC Progression Note (Addendum)
Transition of Care [facility]) - Initial/Assessment Note    Patient Details  Name: Adrienne Villarreal MRN: [id number] Date of Birth: [birth year]  Transition of Care [facility]) CM/SW Contact:    Ralene Bathe, LCSWA Phone Number: 08/22/2021, 5:34 PM  Clinical Narrative:                 17:25-  CSW completed IVC paperwork and faxed to the magistrate for approval.  CSW awaiting signed forms from magistrate.  18:05- Signed IVC approval forms received from magistrate.  Called [employer] to serve patient.  IVC forms left on the patient's chart for [employer] to serve and sign.     Patient Goals and CMS Choice        Expected Discharge Plan and Services                                                Prior Living Arrangements/Services                       Activities of Daily Living Home Assistive Devices/Equipment: Other (Comment) (walker) ADL Screening (condition at time of admission) Patient's cognitive ability adequate to safely complete daily activities?: No Is the patient deaf or have difficulty hearing?: No Does the patient have difficulty seeing, even when wearing glasses/contacts?: No Does the patient have difficulty concentrating, remembering, or making decisions?: No Patient able to express need for assistance with ADLs?: No Does the patient have difficulty dressing or bathing?: No Independently performs ADLs?: Yes (appropriate for developmental age) Does the patient have difficulty walking or climbing stairs?: Yes Weakness of Legs: None Weakness of Arms/Hands: None  Permission Sought/Granted                  Emotional Assessment              Admission diagnosis:  AKI (acute kidney injury) (HCC) [N17.9] Hypotension, unspecified hypotension type [I95.9] Patient Active Problem List   Diagnosis Date Noted   Atypical chest pain    MDD (major depressive disorder), recurrent episode, severe (HCC) 08/21/2021   PTSD (post-traumatic stress disorder) 08/21/2021    AKI (acute kidney injury) (HCC) 08/19/2021   Atrial fibrillation, chronic (HCC) 08/19/2021   Hypotension 08/19/2021   Alcoholic cirrhosis (HCC) 08/19/2021   Prolonged QT interval 08/19/2021   Chronic anticoagulation 08/19/2021   Noncompliance 08/19/2021   Pulmonary embolism (HCC) 08/19/2021   Chronic systolic CHF (congestive heart failure) (HCC) 08/19/2021   PCP:  Pcp, No Pharmacy:   [id number] - [place], [place] - [address] [facility] [address] [place] [address] Phone: [phone] Fax: [phone]     Social Determinants of Health (SDOH) Interventions    Readmission Risk Interventions No flowsheet data found.

## 2021-08-22 NOTE — TOC Progression Note (Addendum)
Transition of Care [facility]) - Initial/Assessment Note    Patient Details  Name: Adrienne Villarreal MRN: [id number] Date of Birth: Feb 12, 1961  Transition of Care [facility]) CM/SW Contact:    Ralene Bathe, LCSWA Phone Number: 08/22/2021, 1:18 PM  Clinical Narrative:                 CSW received consult for inpatient psych for this patient.  CSW contacted [facility] to inquire about their ability to accept the patient at d/c and is awaiting a response.    14:30-  [facility] does not have  bed available today, but may have one tomorrow.  CSW asked to contact [facility] tomorrow.   Patient Goals and CMS Choice        Expected Discharge Plan and Services                                                Prior Living Arrangements/Services                       Activities of Daily Living Home Assistive Devices/Equipment: Other (Comment) (walker) ADL Screening (condition at time of admission) Patient's cognitive ability adequate to safely complete daily activities?: No Is the patient deaf or have difficulty hearing?: No Does the patient have difficulty seeing, even when wearing glasses/contacts?: No Does the patient have difficulty concentrating, remembering, or making decisions?: No Patient able to express need for assistance with ADLs?: No Does the patient have difficulty dressing or bathing?: No Independently performs ADLs?: Yes (appropriate for developmental age) Does the patient have difficulty walking or climbing stairs?: Yes Weakness of Legs: None Weakness of Arms/Hands: None  Permission Sought/Granted                  Emotional Assessment              Admission diagnosis:  AKI (acute kidney injury) (HCC) [N17.9] Hypotension, unspecified hypotension type [I95.9] Patient Active Problem List   Diagnosis Date Noted   Atypical chest pain    MDD (major depressive disorder), recurrent episode, severe (HCC) 08/21/2021   PTSD (post-traumatic stress disorder) 08/21/2021    AKI (acute kidney injury) (HCC) 08/19/2021   Atrial fibrillation, chronic (HCC) 08/19/2021   Hypotension 08/19/2021   Alcoholic cirrhosis (HCC) 08/19/2021   Prolonged QT interval 08/19/2021   Chronic anticoagulation 08/19/2021   Noncompliance 08/19/2021   Pulmonary embolism (HCC) 08/19/2021   Chronic systolic CHF (congestive heart failure) (HCC) 08/19/2021   PCP:  Pcp, No Pharmacy:  No Pharmacies Listed    Social Determinants of Health (SDOH) Interventions    Readmission Risk Interventions No flowsheet data found.

## 2021-08-23 DIAGNOSIS — K7031 Alcoholic cirrhosis of liver with ascites: Secondary | ICD-10-CM

## 2021-08-23 DIAGNOSIS — F332 Major depressive disorder, recurrent severe without psychotic features: Secondary | ICD-10-CM

## 2021-08-23 DIAGNOSIS — E440 Moderate protein-calorie malnutrition: Secondary | ICD-10-CM | POA: Insufficient documentation

## 2021-08-23 LAB — CBC WITH DIFFERENTIAL/PLATELET
Abs Immature Granulocytes: 0.02 K/uL (ref 0.00–0.07)
Basophils Absolute: 0 K/uL (ref 0.0–0.1)
Basophils Relative: 1 %
Eosinophils Absolute: 0.1 K/uL (ref 0.0–0.5)
Eosinophils Relative: 3 %
HCT: 35.3 % — ABNORMAL LOW (ref 36.0–46.0)
Hemoglobin: 11.3 g/dL — ABNORMAL LOW (ref 12.0–15.0)
Immature Granulocytes: 1 %
Lymphocytes Relative: 33 %
Lymphs Abs: 1.2 K/uL (ref 0.7–4.0)
MCH: 30.2 pg (ref 26.0–34.0)
MCHC: 32 g/dL (ref 30.0–36.0)
MCV: 94.4 fL (ref 80.0–100.0)
Monocytes Absolute: 0.4 K/uL (ref 0.1–1.0)
Monocytes Relative: 13 %
Neutro Abs: 1.8 K/uL (ref 1.7–7.7)
Neutrophils Relative %: 49 %
Platelets: 256 K/uL (ref 150–400)
RBC: 3.74 MIL/uL — ABNORMAL LOW (ref 3.87–5.11)
RDW: 16.6 % — ABNORMAL HIGH (ref 11.5–15.5)
WBC: 3.5 K/uL — ABNORMAL LOW (ref 4.0–10.5)
nRBC: 0 % (ref 0.0–0.2)

## 2021-08-23 LAB — COMPREHENSIVE METABOLIC PANEL
ALT: 17 U/L (ref 0–44)
AST: 27 U/L (ref 15–41)
Albumin: 3.3 g/dL — ABNORMAL LOW (ref 3.5–5.0)
Alkaline Phosphatase: 146 U/L — ABNORMAL HIGH (ref 38–126)
Anion gap: 5 (ref 5–15)
BUN: 16 mg/dL (ref 6–20)
CO2: 23 mmol/L (ref 22–32)
Calcium: 10.6 mg/dL — ABNORMAL HIGH (ref 8.9–10.3)
Chloride: 109 mmol/L (ref 98–111)
Creatinine, Ser: 1.01 mg/dL — ABNORMAL HIGH (ref 0.44–1.00)
GFR, Estimated: >60 mL/min (ref >60)
Glucose, Bld: 105 mg/dL — ABNORMAL HIGH (ref 70–99)
Potassium: 3.5 mmol/L (ref 3.5–5.1)
Sodium: 137 mmol/L (ref 135–145)
Total Bilirubin: 1.1 mg/dL (ref 0.3–1.2)
Total Protein: 6.8 g/dL (ref 6.5–8.1)

## 2021-08-23 LAB — MAGNESIUM: Magnesium: 1.8 mg/dL (ref 1.7–2.4)

## 2021-08-23 NOTE — Progress Notes (Signed)
Patient ID: Adrienne Villarreal, female   DOB: Jul 08, 1961, 60 y.o.   MRN: [id number]  PROGRESS NOTE    Adrienne Villarreal  XLK:[id number] DOB: Jan 24, 1961 DOA: 08/18/2021 PCP: Pcp, No   Brief Narrative:  60 year old female with history of alcohol abuse, alcoholic cirrhosis, chronic systolic CHF, atrial fibrillation and PEs on Eliquis, anxiety, homelessness, recent admission from 08/10/2021-08/18/2021 at [facility] for cirrhosis with ascites requiring paracentesis and diuresis and was also found to have pulmonary embolism for which she was restarted on Eliquis (apparently she had been noncompliant with her anticoagulation).  She was discharged to alcohol rehab center on 08/18/2021 but because she was ambulating with a walker, she could not get into the center and she was driven to a homeless shelter; it did not have any beds and patient was sent to emergency room.  On presentation, she was hypotensive with systolic blood pressure in the 70s to 80s and patient complained of ongoing chest pain.  She received IV fluids; creatinine was 2; creatinine was 1.21 on discharge on 08/18/2021.  Assessment & Plan:   Acute kidney injury -Presented with creatinine of 2; baseline creatinine around 1; creatinine was 1.1 on discharge on 08/18/2021 from [facility].  Creatinine had worsened to 2.34 during this hospitalization. -Treated with IV fluids.  Improving.  Creatinine 1.33 on 08/21/2021: Pending for today.  Encourage oral intake.  DC'd IV fluids on 08/21/2021.  Currently not on any diuretics.  Hypotension -Blood pressure on the lower side.  Continue low-dose of metoprolol.  Diuretics on hold.  Chronic systolic CHF Intermittent chest pain -Patient continues to complain of intermittent chest pain: Unclear if this is pain medication seeking behavior or cardiac cause of chest pain. -Currently no signs of volume overload.  Continue low-dose of metoprolol.   -Strict input output.  Daily weights.   Fluid restriction. -Spironolactone, Demadex on hold -Cardiology evaluated the patient on 08/21/2021: Cardiology doubts that patient's chest pain is ischemic chest pain and recommend no further intervention.  Diuretics have not been resumed by cardiology yet.  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  Has an IVC filter.  Was noncompliant with anticoagulation and was found to have pulmonary embolism a few days ago when hospitalized in [facility].  Eliquis was resumed  Suicidal ideation -Patient apparently expressed suicidal ideation on 08/20/2021.  Continue suicide precautions and one-to-one sitter.  Psychiatry evaluation appreciated: Recommend to continue current dose of lorazepam as needed.  Patient has been started on fluoxetine 20 mg daily along with trazodone 25 mg at night as needed for sleep.  Psychiatry will follow the patient again today.  Follow recommendations.  Homelessness Noncompliance -Child psychotherapist following.  Patient apparently refused to work with PT/OT intermittently. -Patient intermittently refuses medical care including treatments.  Patient keeps asking for morphine orally; apparently she takes this medication at home.  I reviewed the PDMP and she does not receive morphine consistently; has only received few pills by various providers intermittently recently.  DVT prophylaxis: Eliquis Code Status: Full Family Communication: None at bedside Disposition Plan: Status is: Inpatient  Remains inpatient appropriate because: Unsafe discharge plan.   Consultants: Psychiatry/cardiology  Procedures: None  Antimicrobials:  None   Subjective: Patient seen and examined at bedside.  Poor  historian.  No overnight fever,    Complains of intermittent chest/back pain/lower extremity pain.     Bedside sitter present at patient's room's door.  Awaiting for [facility] placement  Objective: Vitals:   08/22/21 0908 08/22/21 2204 08/23/21 0624 08/23/21 0900  BP: 108/73 116/75 112/72 114/70  Pulse: 80 69 62 66  Resp: 16 18 17    Temp: 98 F (36.7 C) 98.5 F (36.9 C) 98.4 F (36.9 C) 98 F (36.7 C)  TempSrc: Oral Oral Oral Oral  SpO2: 96% 100% 96% 97%  Weight:      Height:        Intake/Output Summary (Last 24 hours) at 08/23/2021 1952 Last data filed at 08/23/2021 1020 Gross per 24 hour  Intake 645.75 ml  Output 0 ml  Net 645.75 ml   Filed Weights   08/18/21 1711 08/21/21 0518  Weight: 49.9 kg 49.8 kg    Examination:  General exam: No distress.  On room air currently.  Looks chronically ill and deconditioned. Respiratory system: Decreased breath sounds at bases bilaterally Cardiovascular system: Rate controlled; S1-S2 heard Gastrointestinal system: Abdomen is mildly distended; soft and nontender.  Bowel sounds are heard Extremities: Mild lower extremity edema present; no cyanosis Central nervous system: Alert; extremely slow to respond; extremely poor historian.  No focal neurological deficits.  Moves extremities Skin: No obvious ecchymosis/lesions Psychiatry: Very flat affect.  Does not participate in conversation much.  Data Reviewed: I have personally reviewed following labs and imaging studies  CBC: Recent Labs  Lab 08/18/21 1733 08/19/21 0639 08/23/21 0639  WBC 4.6 3.9* 3.5*  NEUTROABS 2.7  --  1.8  HGB 13.0 11.4* 11.3*  HCT 41.2 36.9 35.3*  MCV 95.4 96.6 94.4  PLT 283 253 256   Basic Metabolic Panel: Recent Labs  Lab 08/18/21 1733 08/19/21 0639 08/20/21 0605 08/21/21 0453 08/23/21 0639  NA 135 138 137 135 137  K 4.0 3.6 3.6 3.7 3.5  CL 99 103 103 105 109  CO2 24 25 25 22 23    GLUCOSE 92 97 83 91 105*  BUN 18 23* 25* 24* 16  CREATININE 2.00* 2.34* 1.81* 1.33* 1.01*  CALCIUM 10.9* 10.4* 10.4* 10.1 10.6*  MG  --   --  1.7  --  1.8   GFR: Estimated Creatinine Clearance: 47.1 mL/min (A) (by C-G formula based on SCr of 1.01 mg/dL (H)). Liver Function Tests: Recent Labs  Lab 08/23/21 0639  AST 27  ALT 17  ALKPHOS 146*  BILITOT 1.1  PROT 6.8  ALBUMIN 3.3*   No results for input(s): LIPASE, AMYLASE in the last 168 hours. No results for input(s): AMMONIA in the last 168 hours. Coagulation Profile: No results for input(s): INR, PROTIME in the last 168 hours. Cardiac Enzymes: No results for input(s): CKTOTAL, CKMB, CKMBINDEX, TROPONINI in the last 168 hours. BNP (last 3 results) No results for input(s): PROBNP in the last 8760 hours. HbA1C: No results for input(s): HGBA1C in the last 72 hours. CBG: Recent Labs  Lab 08/19/21 0507  GLUCAP 95   Lipid Profile: No results for input(s): CHOL, HDL, LDLCALC, TRIG, CHOLHDL, LDLDIRECT in the last 72 hours. Thyroid Function Tests: No results for input(s): TSH, T4TOTAL, FREET4, T3FREE, THYROIDAB in the last 72 hours. Anemia Panel: No results for input(s): VITAMINB12, FOLATE, FERRITIN, TIBC, IRON, RETICCTPCT in the last 72 hours. Sepsis Labs: No results for input(s): PROCALCITON, LATICACIDVEN in the last 168 hours.  Recent Results (from the past 240 hour(s))  Resp Panel by RT-PCR (Flu  A&B, Covid) Nasopharyngeal Swab     Status: None   Collection Time: 08/19/21  2:57 AM   Specimen: Nasopharyngeal Swab; Nasopharyngeal(NP) swabs in vial transport medium  Result Value Ref Range Status   SARS Coronavirus 2 by RT PCR NEGATIVE NEGATIVE Final    Comment: (NOTE) SARS-CoV-2 target nucleic acids are NOT DETECTED.  The SARS-CoV-2 RNA is generally detectable in upper respiratory specimens during the acute phase of infection. The lowest concentration of SARS-CoV-2 viral copies this assay can detect is 138 copies/mL. A  negative result does not preclude SARS-Cov-2 infection and should not be used as the sole basis for treatment or other patient management decisions. A negative result may occur with  improper specimen collection/handling, submission of specimen other than nasopharyngeal swab, presence of viral mutation(s) within the areas targeted by this assay, and inadequate number of viral copies(<138 copies/mL). A negative result must be combined with clinical observations, patient history, and epidemiological information. The expected result is Negative.  Fact Sheet for Patients:  EntrepreneurPulse.com.au  Fact Sheet for Healthcare Providers:  IncredibleEmployment.be  This test is no t yet approved or cleared by the [place] FDA and  has been authorized for detection and/or diagnosis of SARS-CoV-2 by FDA under an Emergency Use Authorization (EUA). This EUA will remain  in effect (meaning this test can be used) for the duration of the COVID-19 declaration under Section 564(b)(1) of the Act, 21 U.S.C.section 360bbb-3(b)(1), unless the authorization is terminated  or revoked sooner.       Influenza A by PCR NEGATIVE NEGATIVE Final   Influenza B by PCR NEGATIVE NEGATIVE Final    Comment: (NOTE) The Xpert Xpress SARS-CoV-2/FLU/RSV plus assay is intended as an aid in the diagnosis of influenza from Nasopharyngeal swab specimens and should not be used as a sole basis for treatment. Nasal washings and aspirates are unacceptable for Xpert Xpress SARS-CoV-2/FLU/RSV testing.  Fact Sheet for Patients: EntrepreneurPulse.com.au  Fact Sheet for Healthcare Providers: IncredibleEmployment.be  This test is not yet approved or cleared by the [place] FDA and has been authorized for detection and/or diagnosis of SARS-CoV-2 by FDA under an Emergency Use Authorization (EUA). This EUA will remain in effect (meaning this test can  be used) for the duration of the COVID-19 declaration under Section 564(b)(1) of the Act, 21 U.S.C. section 360bbb-3(b)(1), unless the authorization is terminated or revoked.  Performed at [facility], [place] [address] [place], [place] [address]          Radiology Studies: No results found.      Scheduled Meds:  apixaban  5 mg Oral BID   feeding supplement  237 mL Oral BID BM   ferrous sulfate  325 mg Oral Q breakfast   FLUoxetine  10 mg Oral Daily   folic acid  1 mg Oral Daily   lactulose  10 g Oral BID   latanoprost  1 drop Both Eyes QHS   lidocaine  1 patch Transdermal Q24H   metoprolol tartrate  12.5 mg Oral BID   multivitamin with minerals  1 tablet Oral Daily   pantoprazole  20 mg Oral Daily   thiamine  100 mg Oral Daily   Continuous Infusions:        Adrienne Reasons, MD PhD FACP [facility] 08/23/2021, 7:52 PM
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
## 2021-08-23 NOTE — Progress Notes (Signed)
OT Cancellation Note  Patient Details Name: Adrienne Villarreal MRN: [id number] DOB: 27-Jul-1961   Cancelled Treatment:    Reason Eval/Treat Not Completed: OT screened, no needs identified, will sign off Per RN, pt is ambulating independently and is able to complete all ADL independently. OT will sign off. If pt has a change in status please re-consult. Thank you.   [facility] OTR/L Acute Rehabilitation Services Office: [phone]   Rebeca Alert 08/23/2021, 3:34 PM

## 2021-08-23 NOTE — TOC Progression Note (Addendum)
Transition of Care [facility]) - Initial/Assessment Note    Patient Details  Name: Adrienne Villarreal MRN: [id number] Date of Birth: 1961/01/08  [facility] CM/SW Contact:    Milinda Antis, [place] Phone Number: 08/23/2021, 9:54 AM  Clinical Narrative:                 09:53-  CSW contacted [facility] to inquire about bed availability and is awaiting a response.    11:26-  [facility] denied patient due to "strong suspicion for secondary gain given her homelessness, med seeking for chronic pain issues and with all of her medical issues is really not best suited for [facility]"  12:45-  CSW faxed referral to [facility]  for review.  15:07-  CSW called [facility] at [phone] to check on the status of the referral.  CSW was informed that the referral has not been reviewed and that the facility will call CSW when a decision is made.   15:17-  CSW contacted [facility] services and was informed that the patient has Medicaid that is out of [place] therefore CSW would need to contact the [facility] in [place].  15:20-  CSW contacted the [facility] in [place] at [phone].  The agency reports that they are first come first serve. CSW inquired about the patient's ability to come with SI and SA.  The facility cannot accept with the patient being IVC'D.  15:58-  CSW contacted the [facility] to inquire about the patient receiving services there at the request of the psychiatrist.  CSW was informed that this is a emergency facility that usually holds patients for observation for only 24 hours and then transfers them to the hospital.  The facility does have an outpatient facility that has a walk in clinic from 8-11am on a first come first served basis.  CSW was also informed that the patient can call and inquire about an appointment but it would "be a while" before the session can be scheduled.    16:30-  CSW met with the patient at bedside.  The patient was very guarded and agitated and stated  that she was "tired of people asking her the same questions".  CSW attempted to engage with the patient about about natural support after hearing from psychiatry that the patient mentioned that she was going to call someone when she threatened to leave the hospital yesterday.  The patient stated "people need to stop lying.  I don't understand why everybody gotta lie!  I didn't tell them that I had no friends."  The patient is adamant that she was told that someone would find her  place to go.  CSW inquired about the patient and any previous stays at shelters.  The patient reported that she has never stayed at a shelter and later stated that she was a resident at [facility], but had to leave because of her inability to work.    CSW asked the patient about any family or friends in the area.  The patient stated that she did not have any.  CSW inquired about whether she had anyone who could be a support to her should she be discharged with a safety plan.  The patient then stated "I could find somebody.  My sister might drive down."  CSW inquired about whether the patient could stay with her sister.  The patient reported that her sister had a lot of people living with her and could not stay there.  CSW  then inquired about any children that the patient had.  The patient is estranged from her children   CSW mentioned waiting on a response from [facility].  The patient replied " I will stay at a bus stop before I go back there."  The patient is requesting an apartment where she can stay for $500 a month to get back on her feet.  When informed that CSW could not provide this the patient replied "the lord always makes a way and I will wait for him to."  When informed of the difficulty with finding placement and if [facility] accepts the patient would need to d/c there the patient replied "No.  I'm not going there send me anywhere in the world."  Psychiatry updated on the above information.  TOC leadership  notified.  Patient Goals and CMS Choice        Expected Discharge Plan and Services                                                Prior Living Arrangements/Services                       Activities of Daily Living Home Assistive Devices/Equipment: Other (Comment) (walker) ADL Screening (condition at time of admission) Patient's cognitive ability adequate to safely complete daily activities?: No Is the patient deaf or have difficulty hearing?: No Does the patient have difficulty seeing, even when wearing glasses/contacts?: No Does the patient have difficulty concentrating, remembering, or making decisions?: No Patient able to express need for assistance with ADLs?: No Does the patient have difficulty dressing or bathing?: No Independently performs ADLs?: Yes (appropriate for developmental age) Does the patient have difficulty walking or climbing stairs?: Yes Weakness of Legs: None Weakness of Arms/Hands: None  Permission Sought/Granted                  Emotional Assessment              Admission diagnosis:  AKI (acute kidney injury) ([place]) [N17.9] Hypotension, unspecified hypotension type [I95.9] Patient Active Problem List   Diagnosis Date Noted   Atypical chest pain    MDD (major depressive disorder), recurrent episode, severe ([place]) 08/21/2021   PTSD (post-traumatic stress disorder) 08/21/2021   AKI (acute kidney injury) ([place]) 08/19/2021   Atrial fibrillation, chronic ([place]) 08/19/2021   Hypotension 60/60/0459   Alcoholic cirrhosis ([place]) 97/74/1423   Prolonged QT interval 08/19/2021   Chronic anticoagulation 08/19/2021   Noncompliance 08/19/2021   Pulmonary embolism ([place]) 95/32/0233   Chronic systolic CHF (congestive heart failure) ([place]) 08/19/2021   PCP:  Pcp, No Pharmacy:   [id number] [place] [id number]:  [phone]: [phone]    Social Determinants of Health (SDOH) Interventions    Readmission Risk Interventions No flowsheet data found.

## 2021-08-24 DIAGNOSIS — F332 Major depressive disorder, recurrent severe without psychotic features: Secondary | ICD-10-CM | POA: Diagnosis not present

## 2021-08-24 NOTE — Progress Notes (Signed)
Patient ID: Adrienne Villarreal, female   DOB: September 16, 1960, 60 y.o.   MRN: [id number]  PROGRESS NOTE    Adrienne Villarreal  YQM:[id number] DOB: June 23, 1961 DOA: 08/18/2021 PCP: Pcp, No   Brief Narrative:  60 year old female with history of alcohol abuse, alcoholic cirrhosis, chronic systolic CHF, atrial fibrillation and PEs on Eliquis, anxiety, homelessness, recent admission from 08/10/2021-08/18/2021 at [facility] for cirrhosis with ascites requiring paracentesis and diuresis and was also found to have pulmonary embolism for which she was restarted on Eliquis (apparently she had been noncompliant with her anticoagulation).  She was discharged to alcohol rehab center on 08/18/2021 but because she was ambulating with a walker, she could not get into the center and she was driven to a homeless shelter; it did not have any beds and patient was sent to emergency room.  On presentation, she was hypotensive with systolic blood pressure in the 70s to 80s and patient complained of ongoing chest pain.  She received IV fluids; creatinine was 2; creatinine was 1.21 on discharge on 08/18/2021.  Assessment & Plan:   Acute kidney injury -Presented with creatinine of 2; baseline creatinine around 1; creatinine was 1.1 on discharge on 08/18/2021 from [facility].  Creatinine had worsened to 2.34 during this hospitalization. -Treated with IV fluids.  Improving.  Creatinine 1.33 on 08/21/2021: Pending for today.  Encourage oral intake.  DC'd IV fluids on 08/21/2021.  Currently not on any diuretics. -may consider to restart diuretics pending on oral intake, currently no edema, does not appear to have significant reaccumulation of ascites  Hypotension -Blood pressure on the lower side.  Continue low-dose of metoprolol with holding parameters ,diuretics on hold.  Chronic systolic CHF Intermittent chest pain -Patient continues to complain of intermittent chest pain: Unclear if this is pain medication  seeking behavior or cardiac cause of chest pain. -Currently no signs of volume overload.  Continue low-dose of metoprolol.   -Strict input output.  Daily weights.  Fluid restriction. -Spironolactone, Demadex on hold -Cardiology evaluated the patient on 08/21/2021: Cardiology doubts that patient's chest pain is ischemic chest pain and recommend no further intervention.  Diuretics have not been resumed by cardiology yet.  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  Has an IVC filter.  Was noncompliant with anticoagulation and was found to have pulmonary embolism a few days ago when hospitalized in [facility].  Eliquis was resumed  Suicidal ideation -Patient apparently expressed suicidal ideation on 08/20/2021.  Continue suicide precautions and one-to-one sitter.  Psychiatry evaluation appreciated: Recommend to continue current dose of lorazepam as needed.  Patient has been started on fluoxetine 20 mg daily along with trazodone 25 mg at night as needed for sleep.  Psychiatry will follow the patient again today.  Follow recommendations.  Homelessness Noncompliance -Child psychotherapist following.  Patient apparently refused to work with PT/OT intermittently. -Patient intermittently refuses medical care including treatments.  Patient keeps asking for morphine orally; apparently she takes this medication at home.  I reviewed the PDMP and she does not receive morphine consistently; has only received few pills by various providers intermittently recently. -She appeared calm and cooperative to me today  DVT prophylaxis: Eliquis Code Status: Full Family  Communication: None at bedside Disposition Plan: Status  is: Inpatient  Remains inpatient appropriate because: Unsafe discharge plan.   Consultants: Psychiatry/cardiology  Procedures: None  Antimicrobials: None   Subjective: Patient seen and examined at bedside.  Poor historian.  No overnight fever,  No new complaints, appeared calm and cooperative to me today  bedside sitter present at patient's room's door.  Awaiting for [facility] placement  Objective: Vitals:   08/23/21 0900 08/23/21 2145 08/24/21 0424 08/24/21 1600  BP: 114/70 98/68 103/81 106/81  Pulse: 66 79 62 72  Resp:   18 18  Temp: 98 F (36.7 C) 98 F (36.7 C) 97.8 F (36.6 C) (!) 97 F (36.1 C)  TempSrc: Oral Oral Oral Oral  SpO2: 97% 96% 100% 98%  Weight:      Height:        Intake/Output Summary (Last 24 hours) at 08/24/2021 1926 Last data filed at 08/24/2021 1746 Gross per 24 hour  Intake 540 ml  Output 1 ml  Net 539 ml   Filed Weights   08/18/21 1711 08/21/21 0518  Weight: 49.9 kg 49.8 kg    Examination:  General exam: No distress.  On room air currently.  Looks chronically ill and deconditioned. Respiratory system: Decreased breath sounds at bases bilaterally Cardiovascular system: Rate controlled; S1-S2 heard Gastrointestinal system: Abdomen is mildly distended; soft and nontender.  Bowel sounds are heard Extremities: Mild lower extremity edema present; no cyanosis Central nervous system: Alert; extremely slow to respond; extremely poor historian.  No focal neurological deficits.  Moves extremities Skin: No obvious ecchymosis/lesions Psychiatry: Very flat affect.  Does not participate in conversation much.  Data Reviewed: I have personally reviewed following labs and imaging studies  CBC: Recent Labs  Lab 08/18/21 1733 08/19/21 0639 08/23/21 0639  WBC 4.6 3.9* 3.5*  NEUTROABS 2.7  --  1.8  HGB 13.0 11.4* 11.3*  HCT 41.2 36.9 35.3*  MCV 95.4 96.6 94.4  PLT 283 253 256   Basic  Metabolic Panel: Recent Labs  Lab 08/18/21 1733 08/19/21 0639 08/20/21 0605 08/21/21 0453 08/23/21 0639  NA 135 138 137 135 137  K 4.0 3.6 3.6 3.7 3.5  CL 99 103 103 105 109  CO2 24 25 25 22 23   GLUCOSE 92 97 83 91 105*  BUN 18 23* 25* 24* 16  CREATININE 2.00* 2.34* 1.81* 1.33* 1.01*  CALCIUM 10.9* 10.4* 10.4* 10.1 10.6*  MG  --   --  1.7  --  1.8   GFR: Estimated Creatinine Clearance: 47.1 mL/min (A) (by C-G formula based on SCr of 1.01 mg/dL (H)). Liver Function Tests: Recent Labs  Lab 08/23/21 0639  AST 27  ALT 17  ALKPHOS 146*  BILITOT 1.1  PROT 6.8  ALBUMIN 3.3*   No results for input(s): LIPASE, AMYLASE in the last 168 hours. No results for input(s): AMMONIA in the last 168 hours. Coagulation Profile: No results for input(s): INR, PROTIME in the last 168 hours. Cardiac Enzymes: No results for input(s): CKTOTAL, CKMB, CKMBINDEX, TROPONINI in the last 168 hours. BNP (last 3 results) No results for input(s): PROBNP in the last 8760 hours. HbA1C: No results for input(s): HGBA1C in the last 72 hours. CBG: Recent Labs  Lab 08/19/21 0507  GLUCAP 95   Lipid Profile: No results for input(s): CHOL, HDL, LDLCALC, TRIG, CHOLHDL, LDLDIRECT in the last 72 hours. Thyroid Function Tests: No results for input(s): TSH, T4TOTAL, FREET4, T3FREE, THYROIDAB in the last 72 hours. Anemia Panel: No results for input(s): VITAMINB12, FOLATE, FERRITIN, TIBC, IRON, RETICCTPCT in the  last 72 hours. Sepsis Labs: No results for input(s): PROCALCITON, LATICACIDVEN in the last 168 hours.  Recent Results (from the past 240 hour(s))  Resp Panel by RT-PCR (Flu A&B, Covid) Nasopharyngeal Swab     Status: None   Collection Time: 08/19/21  2:57 AM   Specimen: Nasopharyngeal Swab; Nasopharyngeal(NP) swabs in vial transport medium  Result Value Ref Range Status   SARS Coronavirus 2 by RT PCR NEGATIVE NEGATIVE Final    Comment: (NOTE) SARS-CoV-2 target nucleic acids are NOT DETECTED.  The  SARS-CoV-2 RNA is generally detectable in upper respiratory specimens during the acute phase of infection. The lowest concentration of SARS-CoV-2 viral copies this assay can detect is 138 copies/mL. A negative result does not preclude SARS-Cov-2 infection and should not be used as the sole basis for treatment or other patient management decisions. A negative result may occur with  improper specimen collection/handling, submission of specimen other than nasopharyngeal swab, presence of viral mutation(s) within the areas targeted by this assay, and inadequate number of viral copies(<138 copies/mL). A negative result must be combined with clinical observations, patient history, and epidemiological information. The expected result is Negative.  Fact Sheet for Patients:  BloggerCourse.com  Fact Sheet for Healthcare Providers:  SeriousBroker.it  This test is no t yet approved or cleared by the [place] FDA and  has been authorized for detection and/or diagnosis of SARS-CoV-2 by FDA under an Emergency Use Authorization (EUA). This EUA will remain  in effect (meaning this test can be used) for the duration of the COVID-19 declaration under Section 564(b)(1) of the Act, 21 U.S.C.section 360bbb-3(b)(1), unless the authorization is terminated  or revoked sooner.       Influenza A by PCR NEGATIVE NEGATIVE Final   Influenza B by PCR NEGATIVE NEGATIVE Final    Comment: (NOTE) The Xpert Xpress SARS-CoV-2/FLU/RSV plus assay is intended as an aid in the diagnosis of influenza from Nasopharyngeal swab specimens and should not be used as a sole basis for treatment. Nasal washings and aspirates are unacceptable for Xpert Xpress SARS-CoV-2/FLU/RSV testing.  Fact Sheet for Patients: BloggerCourse.com  Fact Sheet for Healthcare Providers: SeriousBroker.it  This test is not yet approved or  cleared by the [place] FDA and has been authorized for detection and/or diagnosis of SARS-CoV-2 by FDA under an Emergency Use Authorization (EUA). This EUA will remain in effect (meaning this test can be used) for the duration of the COVID-19 declaration under Section 564(b)(1) of the Act, 21 U.S.C. section 360bbb-3(b)(1), unless the authorization is terminated or revoked.  Performed at [facility], [address], [place], [place] [address]          Radiology Studies: No results found.      Scheduled Meds:  apixaban  5 mg Oral BID   feeding supplement  237 mL Oral BID BM   ferrous sulfate  325 mg Oral Q breakfast   FLUoxetine  10 mg Oral Daily   folic acid  1 mg Oral Daily   lactulose  10 g Oral BID   latanoprost  1 drop Both Eyes QHS   lidocaine  1 patch Transdermal Q24H   metoprolol tartrate  12.5 mg Oral BID   multivitamin with minerals  1 tablet Oral Daily   pantoprazole  20 mg Oral Daily   thiamine  100 mg Oral Daily   Continuous Infusions:        Albertine Grates, MD PhD FACP [facility] 08/24/2021, 7:26 PM
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
## 2021-08-25 DIAGNOSIS — F332 Major depressive disorder, recurrent severe without psychotic features: Secondary | ICD-10-CM | POA: Diagnosis not present

## 2021-08-25 MED ORDER — SPIRONOLACTONE 25 MG PO TABS
25.0000 mg | ORAL_TABLET | Freq: Every day | ORAL | Status: DC
  Administered 2021-08-25 – 2021-09-06 (×8): 25 mg via ORAL
  Filled 2021-08-25 (×13): qty 1

## 2021-08-25 MED ORDER — TORSEMIDE 20 MG PO TABS
10.0000 mg | ORAL_TABLET | Freq: Every day | ORAL | Status: DC
  Administered 2021-08-25 – 2021-09-06 (×5): 10 mg via ORAL
  Filled 2021-08-25 (×13): qty 1

## 2021-08-25 NOTE — Consult Note (Signed)
Brief consult note.   This Clinical research associate visited her for the interview. She states that she is not in a mood to talk with anybody, and declined to speak any further. It is considered more beneficial to honor her request at this time.   There is no change in her recommendation. Please see the note from 12/22 for details.

## 2021-08-25 NOTE — TOC Progression Note (Signed)
Transition of Care [facility]) - Progression Note    Patient Details  Name: Adrienne Villarreal MRN: [id number] Date of Birth: October 15, 1960  [facility] CM/SW Contact  Mearl Latin, LCSW Phone Number: 08/25/2021, 5:21 PM  Clinical Narrative:    No psych bed offers at this time. Will continue to fax out referral.         Expected Discharge Plan and Services                                                 Social Determinants of Health (SDOH) Interventions    Readmission Risk Interventions No flowsheet data found.

## 2021-08-25 NOTE — Progress Notes (Signed)
Patient ID: Adrienne Villarreal, female   DOB: December 10, 1960, 60 y.o.   MRN: [id number]  PROGRESS NOTE    Zalina Ruddick  [id number] DOB: Jun 22, 1961 DOA: 08/18/2021 PCP: Pcp, No   Brief Narrative:  60 year old female with history of alcohol abuse, alcoholic cirrhosis, chronic systolic CHF, atrial fibrillation and PEs on Eliquis, anxiety, homelessness, recent admission from 08/10/2021-08/18/2021 at [facility] for cirrhosis with ascites requiring paracentesis and diuresis and was also found to have pulmonary embolism for which she was restarted on Eliquis (apparently she had been noncompliant with her anticoagulation).  She was discharged to alcohol rehab center on 08/18/2021 but because she was ambulating with a walker, she could not get into the center and she was driven to a homeless shelter; it did not have any beds and patient was sent to emergency room.  On presentation, she was hypotensive with systolic blood pressure in the 70s to 80s and patient complained of ongoing chest pain.  She received IV fluids; creatinine was 2; creatinine was 1.21 on discharge on 08/18/2021.  Assessment & Plan:   Acute kidney injury -Presented with creatinine of 2; baseline creatinine around 1; creatinine was 1.1 on discharge on 08/18/2021 from [facility].  Creatinine had worsened to 2.34 during this hospitalization. -Treated with IV fluids.  Improving.  Creatinine 1.33 on 08/21/2021: Pending for today.  Encourage oral intake.  DC'd IV fluids on 08/21/2021.  Currently not on any diuretics. -Resume diuretics on 12/23 due to complaining abdominal distention , no lower extremity edema , patient desired to try diuretic first before consider therapeutic paracentesis , she denies abdominal pain , nontender on exam , she has no fever    Hypotension -Blood pressure on the lower side.  Continue low-dose of metoprolol with holding parameters  -Diuretic resumed on 12/23 per patient request, consider  midodrine if blood pressure drop on diuretics  Chronic systolic CHF Intermittent chest pain -Patient continues to complain of intermittent chest pain: Unclear if this is pain medication seeking behavior or cardiac cause of chest pain. --Cardiology evaluated the patient on 08/21/2021: Cardiology doubts that patient's chest pain is ischemic chest pain and recommend no further intervention.  Diuretics have not been resumed by cardiology yet. 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  Has an IVC filter.  Was noncompliant with anticoagulation and was found to have pulmonary embolism a few days ago when hospitalized in [facility].  Eliquis was resumed  Suicidal ideation -Patient apparently expressed suicidal ideation on 08/20/2021.  Continue suicide precautions and one-to-one sitter.  Psychiatry evaluation appreciated: Recommend to continue current dose of lorazepam as needed.  Patient has been started on fluoxetine 20 mg daily along with trazodone 25 mg at night as needed for sleep.  Psychiatry will follow the patient again today.  Follow recommendations.  Homelessness Noncompliance -Education officer, museum following.  Patient apparently refused to work with PT/OT intermittently. -Patient  intermittently refuses medical care including treatments.  Patient keeps asking for morphine orally; apparently she takes this medication at home.  I reviewed the PDMP and she does not receive morphine consistently; has only received few pills by various providers intermittently recently. -She appeared calm and cooperative to me today  DVT prophylaxis: Eliquis Code Status: Full Family Communication: None at bedside Disposition Plan: Status is: Inpatient  Remains inpatient appropriate because: Unsafe discharge plan.  Currently IV seed, awaiting for behavioral health placement Consultants: Psychiatry/cardiology  Procedures: None  Antimicrobials: None   Subjective:   She is calm and cooperative, she thinks her ascites is building back up, she request to start back on low dose diuretics, she understands her blood pressure is low normal but states  she can tolerate low dose diuretics with low normal bp no fever, denies ab pain, NAD, laying in bed watching TV    bedside sitter present at patient's room's door.  Awaiting for [facility] placement  Objective: Vitals:   08/24/21 2004 08/25/21 0509 08/25/21 0543 08/25/21 1041  BP: 106/71 92/72  98/66  Pulse: 76 63  68  Resp: 16 17  17   Temp: 98.1 F (36.7 C) 98.4 F (36.9 C)  98 F (36.7 C)  TempSrc: Oral Oral    SpO2: 95% 92%  96%  Weight:   49.6 kg   Height:        Intake/Output Summary (Last 24 hours) at 08/25/2021 1239 Last data filed at 08/25/2021 1133 Gross per 24 hour  Intake 1497 ml  Output 950 ml  Net 547 ml   Filed Weights   08/18/21 1711 08/21/21 0518 08/25/21 0543  Weight: 49.9 kg 49.8 kg 49.6 kg    Examination:  General exam: No distress.  On room air currently.  Looks chronically ill and deconditioned. Respiratory system: Decreased breath sounds at bases bilaterally Cardiovascular system: Rate controlled; S1-S2 heard Gastrointestinal system: Abdomen is mildly distended; soft and nontender.  Bowel sounds are  heard Extremities: Mild lower extremity edema present; no cyanosis Central nervous system: Alert; extremely slow to respond; extremely poor historian.  No focal neurological deficits.  Moves extremities Skin: No obvious ecchymosis/lesions Psychiatry: Very flat affect.  Does not participate in conversation much.  Data Reviewed: I have personally reviewed following labs and imaging studies  CBC: Recent Labs  Lab 08/18/21 1733 08/19/21 0639 08/23/21 0639  WBC 4.6 3.9* 3.5*  NEUTROABS 2.7  --  1.8  HGB 13.0 11.4* 11.3*  HCT 41.2 36.9 35.3*  MCV 95.4 96.6 94.4  PLT 283 253 [id number]   Basic Metabolic Panel: Recent Labs  Lab 08/18/21 1733 08/19/21 0639 08/20/21 0605 08/21/21 0453 08/23/21 0639  NA 135 138 137 135 137  K 4.0 3.6 3.6 3.7 3.5  CL 99 103 103 105 109  CO2 24 25 25 22 23   GLUCOSE 92 97 83 91 105*  BUN 18 23* 25* 24* 16  CREATININE 2.00* 2.34* 1.81* 1.33* 1.01*  CALCIUM 10.9* 10.4* 10.4* 10.1 10.6*  MG  --   --  1.7  --  1.8   GFR: Estimated Creatinine Clearance: 47 mL/min (A) (by C-G formula based on SCr of 1.01 mg/dL (H)). Liver Function Tests: Recent Labs  Lab 08/23/21 0639  AST 27  ALT 17  ALKPHOS 146*  BILITOT 1.1  PROT 6.8  ALBUMIN 3.3*   No results for input(s): LIPASE, AMYLASE in the last 168 hours. No results for input(s): AMMONIA in the last 168 hours. Coagulation Profile: No results for input(s):  INR, PROTIME in the last 168 hours. Cardiac Enzymes: No results for input(s): CKTOTAL, CKMB, CKMBINDEX, TROPONINI in the last 168 hours. BNP (last 3 results) No results for input(s): PROBNP in the last 8760 hours. HbA1C: No results for input(s): HGBA1C in the last 72 hours. CBG: Recent Labs  Lab 08/19/21 0507  GLUCAP 95   Lipid Profile: No results for input(s): CHOL, HDL, LDLCALC, TRIG, CHOLHDL, LDLDIRECT in the last 72 hours. Thyroid Function Tests: No results for input(s): TSH, T4TOTAL, FREET4, T3FREE, THYROIDAB in the last 72 hours. Anemia  Panel: No results for input(s): VITAMINB12, FOLATE, FERRITIN, TIBC, IRON, RETICCTPCT in the last 72 hours. Sepsis Labs: No results for input(s): PROCALCITON, LATICACIDVEN in the last 168 hours.  Recent Results (from the past 240 hour(s))  Resp Panel by RT-PCR (Flu A&B, Covid) Nasopharyngeal Swab     Status: None   Collection Time: 08/19/21  2:57 AM   Specimen: Nasopharyngeal Swab; Nasopharyngeal(NP) swabs in vial transport medium  Result Value Ref Range Status   SARS Coronavirus 2 by RT PCR NEGATIVE NEGATIVE Final    Comment: (NOTE) SARS-CoV-2 target nucleic acids are NOT DETECTED.  The SARS-CoV-2 RNA is generally detectable in upper respiratory specimens during the acute phase of infection. The lowest concentration of SARS-CoV-2 viral copies this assay can detect is 138 copies/mL. A negative result does not preclude SARS-Cov-2 infection and should not be used as the sole basis for treatment or other patient management decisions. A negative result may occur with  improper specimen collection/handling, submission of specimen other than nasopharyngeal swab, presence of viral mutation(s) within the areas targeted by this assay, and inadequate number of viral copies(<138 copies/mL). A negative result must be combined with clinical observations, patient history, and epidemiological information. The expected result is Negative.  Fact Sheet for Patients:  EntrepreneurPulse.com.au  Fact Sheet for Healthcare Providers:  IncredibleEmployment.be  This test is no t yet approved or cleared by the [place] FDA and  has been authorized for detection and/or diagnosis of SARS-CoV-2 by FDA under an Emergency Use Authorization (EUA). This EUA will remain  in effect (meaning this test can be used) for the duration of the COVID-19 declaration under Section 564(b)(1) of the Act, 21 U.S.C.section 360bbb-3(b)(1), unless the authorization is terminated  or  revoked sooner.       Influenza A by PCR NEGATIVE NEGATIVE Final   Influenza B by PCR NEGATIVE NEGATIVE Final    Comment: (NOTE) The Xpert Xpress SARS-CoV-2/FLU/RSV plus assay is intended as an aid in the diagnosis of influenza from Nasopharyngeal swab specimens and should not be used as a sole basis for treatment. Nasal washings and aspirates are unacceptable for Xpert Xpress SARS-CoV-2/FLU/RSV testing.  Fact Sheet for Patients: EntrepreneurPulse.com.au  Fact Sheet for Healthcare Providers: IncredibleEmployment.be  This test is not yet approved or cleared by the [place] FDA and has been authorized for detection and/or diagnosis of SARS-CoV-2 by FDA under an Emergency Use Authorization (EUA). This EUA will remain in effect (meaning this test can be used) for the duration of the COVID-19 declaration under Section 564(b)(1) of the Act, 21 U.S.C. section 360bbb-3(b)(1), unless the authorization is terminated or revoked.  Performed at [facility], [place] [address] [place], [place] [address]          Radiology Studies: No results found.      Scheduled Meds:  apixaban  5 mg Oral BID   feeding supplement  237 mL Oral BID BM   ferrous sulfate  325 mg Oral Q breakfast   FLUoxetine  10 mg Oral Daily   folic acid  1 mg Oral Daily   lactulose  10 g Oral BID   latanoprost  1 drop Both Eyes QHS   lidocaine  1 patch Transdermal Q24H   metoprolol tartrate  12.5 mg Oral BID   multivitamin with minerals  1 tablet Oral Daily   pantoprazole  20 mg Oral Daily   thiamine  100 mg Oral Daily   Continuous Infusions:        Albertine Grates, MD PhD FACP [facility] 08/25/2021, 12:39 PM

## 2021-08-25 NOTE — Progress Notes (Signed)
Pt very agitated at change of shift.  She was upset because the sitter was supposed to be sitting in the room and the patient didn't want her in the room with her.  Charge RN Elnita Maxwell attempted to speak with pt and she was loud and verbally aggressive.  Security officers notified and also spoke with pt Public house manager.    At this time, pt is calm and is allowing sitter to be in the room.  She took her HS medication without issues.  Room cleared of trash and clutter.  Currently states pain is "OK" and "I'll let you know if I need the Tramadol".  PRN Benadryl given for sleep.  All needs attended to per pt request.   Adrienne Villarreal BSN RN [facility]

## 2021-08-26 ENCOUNTER — Encounter (HOSPITAL_COMMUNITY): Payer: Self-pay | Admitting: Family Medicine

## 2021-08-26 DIAGNOSIS — F332 Major depressive disorder, recurrent severe without psychotic features: Secondary | ICD-10-CM | POA: Diagnosis not present

## 2021-08-26 NOTE — Progress Notes (Signed)
Patient ID: Adrienne Villarreal, female   DOB: 1960/12/03, 60 y.o.   MRN: [id number]  PROGRESS NOTE    Adrienne Villarreal  [id number] DOB: [birth year] DOA: 08/18/2021 PCP: Pcp, No   Brief Narrative:  60 year old female with history of alcohol abuse, alcoholic cirrhosis, chronic systolic CHF, atrial fibrillation and PEs on Eliquis, anxiety, homelessness, recent admission from 08/10/2021-08/18/2021 at [facility] for cirrhosis with ascites requiring paracentesis and diuresis and was also found to have pulmonary embolism for which she was restarted on Eliquis (apparently she had been noncompliant with her anticoagulation).  She was discharged to alcohol rehab center on 08/18/2021 but because she was ambulating with a walker, she could not get into the center and she was driven to a homeless shelter; it did not have any beds and patient was sent to emergency room.  On presentation, she was hypotensive with systolic blood pressure in the 70s to 80s and patient complained of ongoing chest pain.  She received IV fluids; creatinine was 2; creatinine was 1.21 on discharge on 08/18/2021.  Assessment & Plan:   Acute kidney injury -Presented with creatinine of 2; baseline creatinine around 1; creatinine was 1.1 on discharge on 08/18/2021 from [facility].  Creatinine had worsened to 2.34 during this hospitalization. -Treated with IV fluids.  Improving.  Creatinine 1.33 on 08/21/2021: Pending for today.  Encourage oral intake.  DC'd IV fluids on 08/21/2021.  Currently not on any diuretics. -Resume diuretics on 12/23 due to complaining abdominal distention , no lower extremity edema , patient desired to try diuretic first before consider therapeutic paracentesis , she denies abdominal pain , nontender on exam , she has no fever    Hypotension -Blood pressure on the lower side.  Continue low-dose of metoprolol with holding parameters  -Diuretic resumed on 12/23 per patient request, consider  midodrine if blood pressure drop on diuretics  Chronic systolic CHF Intermittent chest pain -Patient continues to complain of intermittent chest pain: Unclear if this is pain medication seeking behavior or cardiac cause of chest pain. --Cardiology evaluated the patient on 08/21/2021: Cardiology doubts that patient's chest pain is ischemic chest pain and recommend no further intervention.  Diuretics have not been resumed by cardiology yet. 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  Has an IVC filter.  Was noncompliant with anticoagulation and was found to have pulmonary embolism a few days ago when hospitalized in [facility].  Eliquis was resumed  Suicidal ideation -Patient apparently expressed suicidal ideation on 08/20/2021.  Continue suicide precautions and one-to-one sitter.  Psychiatry evaluation appreciated: Recommend to continue current dose of lorazepam as needed.  Patient has been started on fluoxetine 20 mg daily along with trazodone 25 mg at night as needed for sleep.  Psychiatry will follow the patient again today.  Follow recommendations.  Homelessness Noncompliance -Education officer, museum following.  Patient apparently refused to work with PT/OT intermittently. -Patient  intermittently refuses medical care including treatments.  Patient keeps asking for morphine orally; apparently she takes this medication at home.  I reviewed the PDMP and she does not receive morphine consistently; has only received few pills by various providers intermittently recently. -She appeared calm and cooperative to me today  DVT prophylaxis: Eliquis Code Status: Full Family Communication: None at bedside Disposition Plan: Status is: Inpatient  Remains inpatient appropriate because: Unsafe discharge plan.  Currently IV seed, awaiting for behavioral health placement Consultants: Psychiatry/cardiology  Procedures: None  Antimicrobials: None   Subjective:   She is calm and cooperative, she reports she is making more urine after diuretic started yesterday  no fever, denies ab pain, NAD, laying in bed watching TV    bedside sitter present at patient's room's door.  Awaiting for [facility] placement  Objective: Vitals:   08/25/21 2056 08/26/21 0533 08/26/21 0903 08/26/21 0905  BP: 106/68 113/66 102/64 102/64  Pulse: 79 60 70 69  Resp: 18 16  18   Temp: 98 F (36.7 C) 98.4 F (36.9 C)    TempSrc:      SpO2: 99% 96%  98%  Weight:  50.1 kg    Height:        Intake/Output Summary (Last 24 hours) at 08/26/2021 1919 Last data filed at 08/26/2021 1300 Gross per 24 hour  Intake 840 ml  Output --  Net 840 ml   Filed Weights   08/21/21 0518 08/25/21 0543 08/26/21 0533  Weight: 49.8 kg 49.6 kg 50.1 kg    Examination:  General exam: No distress.  On room air currently.  Looks chronically ill and deconditioned. Respiratory system: Decreased breath sounds at bases bilaterally Cardiovascular system: Rate controlled; S1-S2 heard Gastrointestinal system: Abdomen is mildly distended; soft and nontender.  Bowel sounds are heard Extremities: Mild lower extremity edema present; no cyanosis Central nervous system: Alert; extremely slow to respond; extremely poor historian.  No  focal neurological deficits.  Moves extremities Skin: No obvious ecchymosis/lesions Psychiatry: Very flat affect.  Does not participate in conversation much.  Data Reviewed: I have personally reviewed following labs and imaging studies  CBC: Recent Labs  Lab 08/23/21 0639  WBC 3.5*  NEUTROABS 1.8  HGB 11.3*  HCT 35.3*  MCV 94.4  PLT 123456   Basic Metabolic Panel: Recent Labs  Lab 08/20/21 0605 08/21/21 0453 08/23/21 0639  NA 137 135 137  K 3.6 3.7 3.5  CL 103 105 109  CO2 25 22 23   GLUCOSE 83 91 105*  BUN 25* 24* 16  CREATININE 1.81* 1.33* 1.01*  CALCIUM 10.4* 10.1 10.6*  MG 1.7  --  1.8   GFR: Estimated Creatinine Clearance: 47.4 mL/min (A) (by C-G formula based on SCr of 1.01 mg/dL (H)). Liver Function Tests: Recent Labs  Lab 08/23/21 0639  AST 27  ALT 17  ALKPHOS 146*  BILITOT 1.1  PROT 6.8  ALBUMIN 3.3*   No results for input(s): LIPASE, AMYLASE in the last 168 hours. No results for input(s): AMMONIA in the last 168 hours. Coagulation Profile: No results for input(s): INR, PROTIME in the last 168 hours. Cardiac Enzymes: No results for input(s): CKTOTAL, CKMB, CKMBINDEX, TROPONINI in the last 168 hours. BNP (last 3 results) No results for input(s): PROBNP in the last 8760 hours. HbA1C: No results for input(s): HGBA1C in the last 72 hours. CBG: No results for input(s): GLUCAP in the last 168 hours.  Lipid Profile: No results for input(s): CHOL, HDL, LDLCALC, TRIG, CHOLHDL, LDLDIRECT in the last  72 hours. Thyroid Function Tests: No results for input(s): TSH, T4TOTAL, FREET4, T3FREE, THYROIDAB in the last 72 hours. Anemia Panel: No results for input(s): VITAMINB12, FOLATE, FERRITIN, TIBC, IRON, RETICCTPCT in the last 72 hours. Sepsis Labs: No results for input(s): PROCALCITON, LATICACIDVEN in the last 168 hours.  Recent Results (from the past 240 hour(s))  Resp Panel by RT-PCR (Flu A&B, Covid) Nasopharyngeal Swab     Status: None   Collection Time:  08/19/21  2:57 AM   Specimen: Nasopharyngeal Swab; Nasopharyngeal(NP) swabs in vial transport medium  Result Value Ref Range Status   SARS Coronavirus 2 by RT PCR NEGATIVE NEGATIVE Final    Comment: (NOTE) SARS-CoV-2 target nucleic acids are NOT DETECTED.  The SARS-CoV-2 RNA is generally detectable in upper respiratory specimens during the acute phase of infection. The lowest concentration of SARS-CoV-2 viral copies this assay can detect is 138 copies/mL. A negative result does not preclude SARS-Cov-2 infection and should not be used as the sole basis for treatment or other patient management decisions. A negative result may occur with  improper specimen collection/handling, submission of specimen other than nasopharyngeal swab, presence of viral mutation(s) within the areas targeted by this assay, and inadequate number of viral copies(<138 copies/mL). A negative result must be combined with clinical observations, patient history, and epidemiological information. The expected result is Negative.  Fact Sheet for Patients:  EntrepreneurPulse.com.au  Fact Sheet for Healthcare Providers:  IncredibleEmployment.be  This test is no t yet approved or cleared by the [place] FDA and  has been authorized for detection and/or diagnosis of SARS-CoV-2 by FDA under an Emergency Use Authorization (EUA). This EUA will remain  in effect (meaning this test can be used) for the duration of the COVID-19 declaration under Section 564(b)(1) of the Act, 21 U.S.C.section 360bbb-3(b)(1), unless the authorization is terminated  or revoked sooner.       Influenza A by PCR NEGATIVE NEGATIVE Final   Influenza B by PCR NEGATIVE NEGATIVE Final    Comment: (NOTE) The Xpert Xpress SARS-CoV-2/FLU/RSV plus assay is intended as an aid in the diagnosis of influenza from Nasopharyngeal swab specimens and should not be used as a sole basis for treatment. Nasal washings  and aspirates are unacceptable for Xpert Xpress SARS-CoV-2/FLU/RSV testing.  Fact Sheet for Patients: EntrepreneurPulse.com.au  Fact Sheet for Healthcare Providers: IncredibleEmployment.be  This test is not yet approved or cleared by the [place] FDA and has been authorized for detection and/or diagnosis of SARS-CoV-2 by FDA under an Emergency Use Authorization (EUA). This EUA will remain in effect (meaning this test can be used) for the duration of the COVID-19 declaration under Section 564(b)(1) of the Act, 21 U.S.C. section 360bbb-3(b)(1), unless the authorization is terminated or revoked.  Performed at [facility], [place] [address] [place], [place] [address]          Radiology Studies: No results found.      Scheduled Meds:  apixaban  5 mg Oral BID   feeding supplement  237 mL Oral BID BM   ferrous sulfate  325 mg Oral Q breakfast   FLUoxetine  10 mg Oral Daily   folic acid  1 mg Oral Daily   lactulose  10 g Oral BID   latanoprost  1 drop Both Eyes QHS   lidocaine  1 patch Transdermal Q24H   metoprolol tartrate  12.5 mg Oral BID   multivitamin with minerals  1 tablet Oral Daily   pantoprazole  20 mg Oral Daily  spironolactone  25 mg Oral Daily   thiamine  100 mg Oral Daily   torsemide  10 mg Oral Daily   Continuous Infusions:        Albertine Grates, MD PhD FACP [facility] 08/26/2021, 7:19 PM

## 2021-08-27 DIAGNOSIS — F332 Major depressive disorder, recurrent severe without psychotic features: Secondary | ICD-10-CM | POA: Diagnosis not present

## 2021-08-27 LAB — TROPONIN I (HIGH SENSITIVITY)
Troponin I (High Sensitivity): 13 ng/L (ref <18)
Troponin I (High Sensitivity): 12 ng/L (ref <18)

## 2021-08-27 MED ORDER — ALUM & MAG HYDROXIDE-SIMETH 200-200-20 MG/5ML PO SUSP
30.0000 mL | Freq: Four times a day (QID) | ORAL | Status: DC | PRN
  Administered 2021-08-30: 30 mL via ORAL
  Filled 2021-08-27 (×2): qty 30

## 2021-08-27 MED ORDER — LIDOCAINE VISCOUS HCL 2 % MT SOLN
15.0000 mL | Freq: Four times a day (QID) | OROMUCOSAL | Status: DC | PRN
  Filled 2021-08-27: qty 15

## 2021-08-27 MED ORDER — SUCRALFATE 1 GM/10ML PO SUSP
1.0000 g | Freq: Three times a day (TID) | ORAL | Status: DC
  Administered 2021-09-04: 1 g via ORAL
  Filled 2021-08-27 (×19): qty 10

## 2021-08-27 MED ORDER — FERROUS SULFATE 325 (65 FE) MG PO TABS
325.0000 mg | ORAL_TABLET | ORAL | Status: DC
  Administered 2021-08-29 – 2021-09-02 (×3): 325 mg via ORAL
  Filled 2021-08-27 (×5): qty 1

## 2021-08-27 MED ORDER — MORPHINE SULFATE 10 MG/5ML PO SOLN
2.5000 mg | ORAL | Status: DC | PRN
  Administered 2021-08-27 – 2021-09-05 (×11): 2.5 mg via ORAL
  Filled 2021-08-27 (×13): qty 2

## 2021-08-27 NOTE — Progress Notes (Signed)
Patient ID: Adrienne Villarreal, female   DOB: 04-Mar-1961, 60 y.o.   MRN: [id number]  PROGRESS NOTE    Adrienne Villarreal  [id number] DOB: [birth year] DOA: 08/18/2021 PCP: Pcp, No   Brief Narrative:  60 year old female with history of alcohol abuse, alcoholic cirrhosis, chronic systolic CHF, atrial fibrillation and PEs on Eliquis, anxiety, homelessness, recent admission from 08/10/2021-08/18/2021 at [facility] for cirrhosis with ascites requiring paracentesis and diuresis and was also found to have pulmonary embolism for which she was restarted on Eliquis (apparently she had been noncompliant with her anticoagulation).  She was discharged to alcohol rehab center on 08/18/2021 but because she was ambulating with a walker, she could not get into the center and she was driven to a homeless shelter; it did not have any beds and patient was sent to emergency room.  On presentation, she was hypotensive with systolic blood pressure in the 70s to 80s and patient complained of ongoing chest pain.  She received IV fluids; creatinine was 2; creatinine was 1.21 on discharge on 08/18/2021.  Assessment & Plan:   Acute kidney injury, reason for admission -cr peaked at 2.34,down to 1.01 --she requested to Resume diuretics on 12/23 due to complaining abdominal distention  -monitor cr   Hypotension, likely due to underline cirrhosis -Blood pressure on the lower side.  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 -Had underwent recent paracentesis during recent hospitalization at [facility] diuretics on 12/23 due to complaining abdominal distention , no lower extremity edema , patient desired to try diuretic first before consider therapeutic paracentesis , she denies abdominal pain , nontender on exam , she has  no fever  -she understand she may need to have another paracentesis if ab distension gets worse -Outpatient follow-up with PCP/GI   Intermittent chest pain -Patient continues to complain of intermittent chest pain: Unclear if this is pain medication seeking behavior or cardiac cause of chest pain. --Cardiology evaluated the patient on 08/21/2021: Cardiology doubts that patient's chest pain is ischemic chest pain and recommend no further intervention.   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  Has an IVC filter.  Was noncompliant with anticoagulation and was found to have pulmonary embolism a few days ago when hospitalized in [facility].  Eliquis was resumed  Suicidal ideation -Patient apparently expressed suicidal ideation on 08/20/2021.  Continue suicide precautions and one-to-one sitter.  Psychiatry evaluation appreciated: Recommend to continue current dose of lorazepam as needed.  Patient has been started on fluoxetine 20 mg daily along with trazodone 25 mg at night as needed for sleep.  Psychiatry will follow the patient again today.  Follow recommendations.  Homelessness Noncompliance -Education officer, museum following.  Patient apparently refused to work with PT/OT intermittently. -Patient intermittently refuses medical care including treatments.  Patient keeps asking for morphine orally; apparently she takes this medication at home.  Per  PDMP she does not receive  morphine consistently; has only received few pills by various providers intermittently recently.   DVT prophylaxis: Eliquis Code Status: Full Family Communication: None at bedside Disposition Plan: Status is: Inpatient  Remains inpatient appropriate because: Unsafe discharge plan.  Currently IVced, sitter at bedside,  awaiting for behavioral health placement  Consultants: Psychiatry/cardiology  Procedures: None  Antimicrobials: None   Subjective:   She is calm and cooperative, she reports she is making more urine after diuretic started  She denies ab pain, ab nontender on exam She reports central chest pain, chest wall tenderness on exam, she does not want tramadol for pain. She declined lidocain patch  She does not like to take iron daily ,reports iron pills irritate her throat   no fever, no n/v, tolerating diet  NAD  She later ambulated to nurse station with sitter and asked for medication for chest pain, she admitted she has esophagitis /gastritis, oral morphine, GI cocktail helped, she agreed to GI cocktail order and  oral morphine ordered with holding parameters ( hold if sbp less than 100) She also agreed to carafate    bedside sitter in room  Awaiting for [facility] placement  Objective: Vitals:   08/26/21 0903 08/26/21 0905 08/26/21 1933 08/27/21 0554  BP: 102/64 102/64 95/68 97/70   Pulse: 70 69 88 77  Resp:  18 18 18   Temp:   98.4 F (36.9 C) 97.7 F (36.5 C)  TempSrc:   Oral Oral  SpO2:  98% 95% 94%  Weight:    51.6 kg  Height:        Intake/Output Summary (Last 24 hours) at 08/27/2021 1029 Last data filed at 08/26/2021 2000 Gross per 24 hour  Intake 480 ml  Output --  Net 480 ml   Filed Weights   08/25/21 0543 08/26/21 0533 08/27/21 0554  Weight: 49.6 kg 50.1 kg 51.6 kg    Examination:  General exam: No distress.  On room air currently.  Looks chronically ill and deconditioned. Respiratory system: Decreased breath sounds at bases bilaterally,  chest wall tenderness Cardiovascular system: Rate controlled; S1-S2 heard Gastrointestinal system: Abdomen is mildly distended; soft and nontender.  Bowel sounds are heard Extremities: Mild lower extremity edema present; no cyanosis Central nervous system: Alert; extremely slow to respond; extremely poor historian.  No focal neurological deficits.  Moves extremities Skin: No obvious ecchymosis/lesions Psychiatry: Very flat affect.  Does not participate in conversation much.  Data Reviewed: I have personally reviewed following labs and imaging studies  CBC: Recent Labs  Lab 08/23/21 0639  WBC 3.5*  NEUTROABS 1.8  HGB 11.3*  HCT 35.3*  MCV 94.4  PLT 256   Basic Metabolic Panel: Recent Labs  Lab 08/21/21 0453 08/23/21 0639  NA 135 137  K 3.7 3.5  CL 105 109  CO2 22 23  GLUCOSE 91 105*  BUN 24* 16  CREATININE 1.33* 1.01*  CALCIUM 10.1 10.6*  MG  --  1.8   GFR: Estimated Creatinine Clearance: 48.9 mL/min (A) (by C-G formula based on SCr of 1.01 mg/dL (H)). Liver Function Tests: Recent Labs  Lab 08/23/21 0639  AST 27  ALT 17  ALKPHOS 146*  BILITOT 1.1  PROT 6.8  ALBUMIN 3.3*   No results for input(s): LIPASE, AMYLASE in the last 168 hours. No results for input(s): AMMONIA in the last 168 hours. Coagulation Profile: No results for input(s): INR, PROTIME in the last 168 hours. Cardiac Enzymes: No results for input(s): CKTOTAL, CKMB, CKMBINDEX, TROPONINI  in the last 168 hours. BNP (last 3 results) No results for input(s): PROBNP in the last 8760 hours. HbA1C: No results for input(s): HGBA1C in the last 72 hours. CBG: No results for input(s): GLUCAP in the last 168 hours.  Lipid Profile: No results for input(s): CHOL, HDL, LDLCALC, TRIG, CHOLHDL, LDLDIRECT in the last 72 hours. Thyroid Function Tests: No results for input(s): TSH, T4TOTAL, FREET4, T3FREE, THYROIDAB in the last 72 hours. Anemia Panel: No results for input(s): VITAMINB12, FOLATE, FERRITIN, TIBC,  IRON, RETICCTPCT in the last 72 hours. Sepsis Labs: No results for input(s): PROCALCITON, LATICACIDVEN in the last 168 hours.  Recent Results (from the past 240 hour(s))  Resp Panel by RT-PCR (Flu A&B, Covid) Nasopharyngeal Swab     Status: None   Collection Time: 08/19/21  2:57 AM   Specimen: Nasopharyngeal Swab; Nasopharyngeal(NP) swabs in vial transport medium  Result Value Ref Range Status   SARS Coronavirus 2 by RT PCR NEGATIVE NEGATIVE Final    Comment: (NOTE) SARS-CoV-2 target nucleic acids are NOT DETECTED.  The SARS-CoV-2 RNA is generally detectable in upper respiratory specimens during the acute phase of infection. The lowest concentration of SARS-CoV-2 viral copies this assay can detect is 138 copies/mL. A negative result does not preclude SARS-Cov-2 infection and should not be used as the sole basis for treatment or other patient management decisions. A negative result may occur with  improper specimen collection/handling, submission of specimen other than nasopharyngeal swab, presence of viral mutation(s) within the areas targeted by this assay, and inadequate number of viral copies(<138 copies/mL). A negative result must be combined with clinical observations, patient history, and epidemiological information. The expected result is Negative.  Fact Sheet for Patients:  BloggerCourse.com  Fact Sheet for Healthcare Providers:  SeriousBroker.it  This test is no t yet approved or cleared by the [place] FDA and  has been authorized for detection and/or diagnosis of SARS-CoV-2 by FDA under an Emergency Use Authorization (EUA). This EUA will remain  in effect (meaning this test can be used) for the duration of the COVID-19 declaration under Section 564(b)(1) of the Act, 21 U.S.C.section 360bbb-3(b)(1), unless the authorization is terminated  or revoked sooner.       Influenza A by PCR NEGATIVE NEGATIVE Final    Influenza B by PCR NEGATIVE NEGATIVE Final    Comment: (NOTE) The Xpert Xpress SARS-CoV-2/FLU/RSV plus assay is intended as an aid in the diagnosis of influenza from Nasopharyngeal swab specimens and should not be used as a sole basis for treatment. Nasal washings and aspirates are unacceptable for Xpert Xpress SARS-CoV-2/FLU/RSV testing.  Fact Sheet for Patients: BloggerCourse.com  Fact Sheet for Healthcare Providers: SeriousBroker.it  This test is not yet approved or cleared by the [place] FDA and has been authorized for detection and/or diagnosis of SARS-CoV-2 by FDA under an Emergency Use Authorization (EUA). This EUA will remain in effect (meaning this test can be used) for the duration of the COVID-19 declaration under Section 564(b)(1) of the Act, 21 U.S.C. section 360bbb-3(b)(1), unless the authorization is terminated or revoked.  Performed at [facility], [address] [place], [place] [address]          Radiology Studies: No results found.      Scheduled Meds:  apixaban  5 mg Oral BID   feeding supplement  237 mL Oral BID BM   [START ON 08/29/2021] ferrous sulfate  325 mg Oral Q48H   FLUoxetine  10 mg Oral Daily  folic acid  1 mg Oral Daily   lactulose  10 g Oral BID   latanoprost  1 drop Both Eyes QHS   lidocaine  1 patch Transdermal Q24H   metoprolol tartrate  12.5 mg Oral BID   multivitamin with minerals  1 tablet Oral Daily   pantoprazole  20 mg Oral Daily   spironolactone  25 mg Oral Daily   thiamine  100 mg Oral Daily   torsemide  10 mg Oral Daily   Continuous Infusions:        Florencia Reasons, MD PhD FACP [facility] 08/27/2021, 10:29 AM

## 2021-08-28 DIAGNOSIS — R9431 Abnormal electrocardiogram [ECG] [EKG]: Secondary | ICD-10-CM

## 2021-08-28 DIAGNOSIS — F4310 Post-traumatic stress disorder, unspecified: Secondary | ICD-10-CM

## 2021-08-28 DIAGNOSIS — F331 Major depressive disorder, recurrent, moderate: Secondary | ICD-10-CM

## 2021-08-28 DIAGNOSIS — R079 Chest pain, unspecified: Secondary | ICD-10-CM

## 2021-08-28 DIAGNOSIS — I4891 Unspecified atrial fibrillation: Secondary | ICD-10-CM

## 2021-08-28 DIAGNOSIS — F1010 Alcohol abuse, uncomplicated: Secondary | ICD-10-CM

## 2021-08-28 DIAGNOSIS — Z5900 Homelessness unspecified: Secondary | ICD-10-CM

## 2021-08-28 DIAGNOSIS — R45851 Suicidal ideations: Secondary | ICD-10-CM

## 2021-08-28 DIAGNOSIS — F332 Major depressive disorder, recurrent severe without psychotic features: Secondary | ICD-10-CM | POA: Diagnosis not present

## 2021-08-28 LAB — CBC WITH DIFFERENTIAL/PLATELET
Abs Immature Granulocytes: 0.02 10*3/uL (ref 0.00–0.07)
Basophils Absolute: 0 10*3/uL (ref 0.0–0.1)
Basophils Relative: 1 %
Eosinophils Absolute: 0.1 10*3/uL (ref 0.0–0.5)
Eosinophils Relative: 3 %
HCT: 34.4 % — ABNORMAL LOW (ref 36.0–46.0)
Hemoglobin: 10.7 g/dL — ABNORMAL LOW (ref 12.0–15.0)
Immature Granulocytes: 1 %
Lymphocytes Relative: 27 %
Lymphs Abs: 1 10*3/uL (ref 0.7–4.0)
MCH: 29.4 pg (ref 26.0–34.0)
MCHC: 31.1 g/dL (ref 30.0–36.0)
MCV: 94.5 fL (ref 80.0–100.0)
Monocytes Absolute: 0.5 10*3/uL (ref 0.1–1.0)
Monocytes Relative: 13 %
Neutro Abs: 2 10*3/uL (ref 1.7–7.7)
Neutrophils Relative %: 55 %
Platelets: 303 10*3/uL (ref 150–400)
RBC: 3.64 MIL/uL — ABNORMAL LOW (ref 3.87–5.11)
RDW: 16.8 % — ABNORMAL HIGH (ref 11.5–15.5)
WBC: 3.6 10*3/uL — ABNORMAL LOW (ref 4.0–10.5)
nRBC: 0.6 % — ABNORMAL HIGH (ref 0.0–0.2)

## 2021-08-28 LAB — COMPREHENSIVE METABOLIC PANEL
ALT: 18 U/L (ref 0–44)
AST: 31 U/L (ref 15–41)
Albumin: 3.2 g/dL — ABNORMAL LOW (ref 3.5–5.0)
Alkaline Phosphatase: 135 U/L — ABNORMAL HIGH (ref 38–126)
Anion gap: 10 (ref 5–15)
BUN: 15 mg/dL (ref 6–20)
CO2: 20 mmol/L — ABNORMAL LOW (ref 22–32)
Calcium: 10.2 mg/dL (ref 8.9–10.3)
Chloride: 109 mmol/L (ref 98–111)
Creatinine, Ser: 0.96 mg/dL (ref 0.44–1.00)
GFR, Estimated: >60 mL/min (ref >60)
Glucose, Bld: 84 mg/dL (ref 70–99)
Potassium: 4.3 mmol/L (ref 3.5–5.1)
Sodium: 139 mmol/L (ref 135–145)
Total Bilirubin: 0.7 mg/dL (ref 0.3–1.2)
Total Protein: 6.5 g/dL (ref 6.5–8.1)

## 2021-08-28 LAB — MAGNESIUM: Magnesium: 1.8 mg/dL (ref 1.7–2.4)

## 2021-08-28 MED ORDER — NAPHAZOLINE-GLYCERIN 0.012-0.25 % OP SOLN
1.0000 [drp] | Freq: Four times a day (QID) | OPHTHALMIC | Status: DC | PRN
  Filled 2021-08-28: qty 15

## 2021-08-28 MED ORDER — ARIPIPRAZOLE 2 MG PO TABS
2.0000 mg | ORAL_TABLET | Freq: Every day | ORAL | Status: DC
  Filled 2021-08-28 (×10): qty 1

## 2021-08-28 NOTE — Progress Notes (Signed)
PROGRESS NOTE    Adrienne Villarreal  [id number] DOB: 1960-12-18 DOA: 08/18/2021 PCP: Pcp, No     Brief Narrative:  60 year old female with history of alcohol abuse, alcoholic cirrhosis, chronic systolic CHF, atrial fibrillation and PEs on Eliquis, anxiety, homelessness, recent admission from 08/10/2021-08/18/2021 at [facility] for cirrhosis with ascites requiring paracentesis and diuresis and was also found to have pulmonary embolism for which she was restarted on Eliquis (apparently she had been noncompliant with her anticoagulation).  She was discharged to alcohol rehab center on 08/18/2021 but because she was ambulating with a walker, she could not get into the center and she was driven to a homeless shelter; it did not have any beds and patient was sent to emergency room.  On presentation, she was hypotensive with systolic blood pressure in the 70s to 80s and patient complained of ongoing chest pain.  She received IV fluids; creatinine was 2; creatinine was 1.21 on discharge on 08/18/2021.  Assessment & Plan:   Principal Problem:   MDD (major depressive disorder), recurrent episode, severe (HCC) Active Problems:   AKI (acute kidney injury) (HCC)   Atrial fibrillation, chronic (HCC)   Hypotension   Alcoholic cirrhosis (HCC)   Prolonged QT interval   Chronic anticoagulation   Noncompliance   Pulmonary embolism (HCC)   Chronic systolic CHF (congestive heart failure) (HCC)   PTSD (post-traumatic stress disorder)   Atypical chest pain   Malnutrition of moderate degree   Acute kidney injury, reason for admission --cr peaked at 2.34,down to 1.01, today 0.96 back on diuretics which were resumed on 12/23 due to complaining abdominal distention  -monitor cr intermittently as needed    Hypotension, likely due to underline cirrhosis -Blood pressure on the lower side.  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 -Resume diuretics on 12/23 due to complaining abdominal distention , no lower extremity edema , patient desired to try diuretic first before consider therapeutic paracentesis , she denies abdominal pain , nontender on exam , she has no fever  -she understand she may need to have another paracentesis if ab distension gets worse -Outpatient follow-up with PCP/GI   Intermittent chest pain -Patient continues to complain of intermittent chest pain: Unclear if this is pain medication seeking behavior, seems less likely cardiac cause of chest pain. -Cardiology evaluated the patient on 08/21/2021: Cardiology doubts that patient's chest pain is ischemic chest pain and recommend no further intervention.   -she reports h/o esophagitis/gastritis -she agreed to gi cocktail, carafate, she states she was on oral morphine for chest pain at home, she requests to restart oral morphine, she agreed to add on holding parameters. 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  Has an IVC filter.  Was noncompliant with anticoagulation and was found to have pulmonary embolism a few days ago when hospitalized in [facility].  Eliquis was resumed   Suicidal ideation. MDD, PTSD -Patient apparently expressed suicidal ideation on 08/20/2021.  Continue suicide precautions  and one-to-one sitter.  Psychiatry evaluation appreciated: Recommend to continue current dose of lorazepam as needed.  Patient has been started on fluoxetine 20 mg daily along with trazodone 25 mg at night as needed for sleep.  Psychiatry will follow the patient again today.  -recs: continue fluoxetine, trazodone, add abilify 2 mg, plan to taper and d/c lorazepam prior to d/c, continue IVS w/ sitter   Homelessness Noncompliance -Education officer, museum following.  Patient apparently refused to work with PT/OT intermittently. -Patient intermittently refuses medical care including treatments.  Patient keeps asking for morphine orally; apparently she takes this medication at home.  Per  PDMP she does not receive morphine consistently; has only received few pills by various providers intermittently recently.     DVT prophylaxis: Eliquis Code Status: Full Family Communication: None at bedside Disposition Plan: Status is: Inpatient   Remains inpatient appropriate because: Unsafe discharge plan.  Currently IVced, sitter at bedside,  awaiting for behavioral health placement   Consultants: Psychiatry/cardiology   Procedures: None   Antimicrobials: None     Subjective:     She is calm and cooperative She denies ab pain, ab nontender on exam She reports central chest pain, chest wall tenderness on exam, she asks about continuing morphine   bedside sitter in room  Awaiting for [facility] placement  Objective: Vitals:   08/28/21 0435 08/28/21 0500 08/28/21 0813 08/28/21 1623  BP: 98/62  100/71 108/76  Pulse: 73  64 62  Resp: 18  19   Temp: 98.4 F (36.9 C)  97.7 F (36.5 C)   TempSrc: Oral  Oral   SpO2: 96%  96%   Weight:  59.8 kg    Height:        Intake/Output Summary (Last 24 hours) at 08/28/2021 1657 Last data filed at 08/28/2021 0830 Gross per 24 hour  Intake 120 ml  Output 600 ml  Net -480 ml   Filed Weights   08/26/21 0533 08/27/21 0554 08/28/21 0500  Weight: 50.1 kg 51.6 kg 59.8 kg     Examination:  General exam: Appears calm and comfortable  Respiratory system: Clear to auscultation. Respiratory effort normal. Cardiovascular system: S1 & S2 heard, RRR. No JVD, murmurs, rubs, gallops or clicks. No pedal edema. Gastrointestinal system: Abdomen is nondistended, soft and nontender. No organomegaly or masses felt. Normal bowel sounds heard. Central nervous system: Alert and oriented. No focal neurological deficits. Extremities: Symmetric 5 x 5 power. Skin: No rashes, lesions or ulcers Psychiatry: Judgement and insight appear fair. Mood & affect appropriate.     Data Reviewed: I have personally reviewed following labs and imaging studies  CBC: Recent Labs  Lab 08/23/21 0639 08/28/21 0436  WBC 3.5* 3.6*  NEUTROABS 1.8 2.0  HGB 11.3* 10.7*  HCT 35.3* 34.4*  MCV 94.4 94.5  PLT 256 [id number]   Basic Metabolic Panel: Recent Labs  Lab 08/23/21 0639 08/28/21 0436  NA 137 139  K 3.5 4.3  CL 109 109  CO2 23 20*  GLUCOSE 105* 84  BUN 16 15  CREATININE 1.01* 0.96  CALCIUM 10.6* 10.2  MG 1.8 1.8   GFR: Estimated Creatinine Clearance: 52.2 mL/min (by C-G formula based on SCr of 0.96 mg/dL). Liver Function Tests: Recent Labs  Lab 08/23/21 0639 08/28/21 0436  AST 27 31  ALT 17 18  ALKPHOS 146* 135*  BILITOT 1.1 0.7  PROT 6.8 6.5  ALBUMIN 3.3* 3.2*   No results for input(s): LIPASE, AMYLASE in the last 168 hours. No results for input(s): AMMONIA in the last 168 hours. Coagulation Profile: No results for input(s): INR, PROTIME in the last 168 hours. Cardiac Enzymes: No results for input(s): CKTOTAL, CKMB, CKMBINDEX, TROPONINI in the last 168 hours. BNP (last 3 results) No results for input(s): PROBNP in the last 8760 hours. HbA1C: No results for input(s): HGBA1C in the last 72 hours. CBG: No results for input(s): GLUCAP in the last 168 hours. Lipid Profile: No results for input(s): CHOL, HDL, LDLCALC, TRIG, CHOLHDL, LDLDIRECT in the last 72  hours. Thyroid Function Tests: No results for input(s): TSH, T4TOTAL, FREET4, T3FREE, THYROIDAB in the last 72 hours. Anemia Panel: No results for input(s): VITAMINB12, FOLATE, FERRITIN, TIBC, IRON, RETICCTPCT in the last 72 hours. Urine analysis:    Component Value Date/Time   COLORURINE YELLOW 08/19/2021 2328   APPEARANCEUR CLEAR 08/19/2021 2328   LABSPEC <1.005 (L) 08/19/2021 2328   PHURINE 6.0 08/19/2021 2328   GLUCOSEU NEGATIVE 08/19/2021 2328   HGBUR NEGATIVE 08/19/2021 2328   BILIRUBINUR NEGATIVE 08/19/2021 2328   KETONESUR NEGATIVE 08/19/2021 2328   PROTEINUR NEGATIVE 08/19/2021 2328   NITRITE NEGATIVE 08/19/2021 2328   LEUKOCYTESUR NEGATIVE 08/19/2021 2328   Sepsis Labs: @LABRCNTIP (procalcitonin:4,lacticidven:4)  ) Recent Results (from the past 240 hour(s))  Resp Panel by RT-PCR (Flu A&B, Covid) Nasopharyngeal Swab     Status: None   Collection Time: 08/19/21  2:57 AM   Specimen: Nasopharyngeal Swab; Nasopharyngeal(NP) swabs in vial transport medium  Result Value Ref Range Status   SARS Coronavirus 2 by RT PCR NEGATIVE NEGATIVE Final    Comment: (NOTE) SARS-CoV-2 target nucleic acids are NOT DETECTED.  The SARS-CoV-2 RNA is generally detectable in upper respiratory specimens during the acute phase of infection. The lowest concentration of SARS-CoV-2 viral copies this assay can detect is 138 copies/mL. A negative result does not preclude SARS-Cov-2 infection and should not be used as the sole basis for treatment or other patient management decisions. A negative result may occur with  improper specimen collection/handling, submission of specimen other than nasopharyngeal swab, presence of viral mutation(s) within the areas targeted by this assay, and inadequate number of viral copies(<138 copies/mL). A negative result must be combined with clinical observations, patient history, and epidemiological information. The expected result is Negative.  Fact Sheet for  Patients:  EntrepreneurPulse.com.au  Fact Sheet for Healthcare Providers:  IncredibleEmployment.be  This test is no t yet approved or cleared by the [place] FDA and  has been authorized for detection and/or diagnosis of SARS-CoV-2 by FDA under an Emergency Use Authorization (EUA). This EUA will remain  in effect (meaning this test can be used) for the duration of the COVID-19 declaration under Section 564(b)(1) of the Act, 21 U.S.C.section 360bbb-3(b)(1), unless the authorization is terminated  or revoked sooner.       Influenza A by PCR NEGATIVE NEGATIVE Final   Influenza B by PCR NEGATIVE NEGATIVE Final    Comment: (NOTE) The Xpert Xpress SARS-CoV-2/FLU/RSV plus assay is intended as an aid in the diagnosis of influenza from Nasopharyngeal swab specimens and should not be used as a sole basis for treatment. Nasal washings and aspirates are unacceptable for Xpert Xpress SARS-CoV-2/FLU/RSV testing.  Fact Sheet for Patients: EntrepreneurPulse.com.au  Fact Sheet for Healthcare Providers: IncredibleEmployment.be  This test is not yet approved or cleared by the [place] FDA and has been authorized for detection and/or diagnosis of SARS-CoV-2 by FDA  under an Emergency Use Authorization (EUA). This EUA will remain in effect (meaning this test can be used) for the duration of the COVID-19 declaration under Section 564(b)(1) of the Act, 21 U.S.C. section 360bbb-3(b)(1), unless the authorization is terminated or revoked.  Performed at [facility], [place] [address] [place], [place] [address]          Radiology Studies: No results found.      Scheduled Meds:  apixaban  5 mg Oral BID   feeding supplement  237 mL Oral BID BM   [START ON 08/29/2021] ferrous sulfate  325 mg Oral Q48H   FLUoxetine  10 mg Oral Daily   folic acid  1 mg Oral Daily   lactulose  10 g Oral BID   latanoprost   1 drop Both Eyes QHS   metoprolol tartrate  12.5 mg Oral BID   multivitamin with minerals  1 tablet Oral Daily   pantoprazole  20 mg Oral Daily   spironolactone  25 mg Oral Daily   sucralfate  1 g Oral TID WC & HS   thiamine  100 mg Oral Daily   torsemide  10 mg Oral Daily   Continuous Infusions:   LOS: 8 days    Time spent: 35 min    Emeterio Reeve, MD [facility] Pager [phone]  If 7PM-7AM, please contact night-coverage [email or web address] Password [facility] 08/28/2021, 4:57 PM
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
## 2021-08-28 NOTE — Plan of Care (Signed)

## 2021-08-29 NOTE — Progress Notes (Signed)
Nutrition Follow-up  DOCUMENTATION CODES:   Non-severe (moderate) malnutrition in context of chronic illness  INTERVENTION:  - Continue with diet liberalization   - Continue Ensure Enlive po BID, each supplement provides 350 kcal and 20 grams of protein  - Continue MVI with minerals daily  - Magic cup TID with meals, each supplement provides 290 kcal and 9 grams of protein  - Nighttime snack to aid in nutritional adequacy  NUTRITION DIAGNOSIS:   Moderate Malnutrition related to chronic illness (EtOH abuse, cirrhosis, CHF) as evidenced by mild fat depletion, moderate fat depletion, mild muscle depletion, moderate muscle depletion, severe muscle depletion.  On-going  GOAL:   Patient will meet greater than or equal to 90% of their needs  Progressing- addressing needs through diet liberalization and nutrition supplements  MONITOR:   PO intake, Supplement acceptance, Weight trends, Labs, I & O's  REASON FOR ASSESSMENT:   Malnutrition Screening Tool    ASSESSMENT:   60 year old female with history of alcohol abuse, alcoholic cirrhosis, chronic systolic CHF, atrial fibrillation and PEs on Eliquis, anxiety, SI, homelessness, recent admission from 08/10/2021-08/18/2021 at [facility] for cirrhosis with ascites requiring paracentesis and diuresis and was also found to have pulmonary embolism for which she was restarted on Eliquis (apparently she had been noncompliant with her anticoagulation).  She was discharged to alcohol rehab center on 08/18/2021. Admitted now with AKI and hypotension  Pt reports eating most of her meals. She states that she eats just until she is comfortable otherwise she gets anxious if she over eats. She is tolerating the foods she is offered on the regular diet however feels as though the portion sizes are too small and some of the foods she is brought is not prepared the way she enjoys and would like to receive ice cream with her meals and a snack  daily. Pt reports that she really enjoys eating healthy foods such as pinto beans, black eye peas, collard greens, and cooked vegetables. She has been drinking all of the Ensure she has been given.   Documented meal completions: 12/25- 100% x 3 meals 12/26- 0-50% x 3 recorded meals  Admit weight: 49.8 kg Current weight: 59.8 kg  Medications: ferrous sulfate every 48 hours, folic acid, lactulose, MVI daily, sucralfate QID, thiamine 100mg , torsemide  Labs: reviewed  Diet Order:   Diet Order             Diet regular Room service appropriate? Yes; Fluid consistency: Thin; Fluid restriction: 1500 mL Fluid  Diet effective now                   EDUCATION NEEDS:   Not appropriate for education at this time  Skin:  Skin Assessment: Reviewed RN Assessment  Last BM:  08/27/21  Height:   Ht Readings from Last 1 Encounters:  08/18/21 5\' 3"  (1.6 m)    Weight:   Wt Readings from Last 1 Encounters:  08/28/21 59.8 kg    BMI:  Body mass index is 23.35 kg/m.  Estimated Nutritional Needs:   Kcal:  1550-1750  Protein:  75-90 grams  Fluid:  1.5L  , RDN, LDN Clinical Nutrition

## 2021-08-29 NOTE — Progress Notes (Signed)
PROGRESS NOTE    Adrienne Villarreal  [id number] DOB: 1961-02-13 DOA: 08/18/2021 PCP: Pcp, No     Brief Narrative:  60 year old female with history of alcohol abuse, alcoholic cirrhosis, chronic systolic CHF, atrial fibrillation and PEs on Eliquis, anxiety, homelessness, recent admission from 08/10/2021-08/18/2021 at [facility] for cirrhosis with ascites requiring paracentesis and diuresis and was also found to have pulmonary embolism for which she was restarted on Eliquis (apparently she had been noncompliant with her anticoagulation).  She was discharged to alcohol rehab center on 08/18/2021 but because she was ambulating with a walker, she could not get into the center and she was driven to a homeless shelter; it did not have any beds and patient was sent to emergency room.  On presentation, she was hypotensive with systolic blood pressure in the 70s to 80s and patient complained of ongoing chest pain.  She received IV fluids; creatinine was 2; creatinine was 1.21 on discharge on 08/18/2021. Creatinine has trended to normal. She has been seen by psychiatry here and put on IVC order dt SI. C/o chest pain, for which she reports getting morphine as outpatient - PDMP few Rx sporadically for this, she was cleared by cardiology likely [facility] chest pain  Patient is medically stable, has been tolerating fluoxetine, trazodone, abilify. Requesting psychiatry recxs re dispo: plan is for discharge to inpatient rehab vs renew/continue IVC. If patient has capacity to refuse treatment in [facility] then can be d/c home.   Assessment & Plan:   Principal Problem:   MDD (major depressive disorder), recurrent episode, severe ([place]) Active Problems:   AKI (acute kidney injury) ([place])   Atrial fibrillation, chronic (HCC)   Hypotension   Alcoholic cirrhosis (HCC)   Prolonged QT interval   Chronic anticoagulation   Noncompliance   Pulmonary embolism (HCC)   Chronic systolic CHF (congestive heart failure)  (HCC)   PTSD (post-traumatic stress disorder)   Atypical chest pain   Malnutrition of moderate degree   Acute kidney injury, reason for admission --cr peaked at 2.34,down to 1.01, today normal, has been back on diuretics which were resumed on 12/23 due to complaining abdominal distention  -monitor cr intermittently as needed    Hypotension, likely due to underline cirrhosis -Blood pressure on the lower side.  Continue low-dose of metoprolol with holding parameters  -Diuretic resumed on 12/23 per patient request, consider midodrine if blood pressure drop on diuretics  BP Readings from Last 3 Encounters:  08/29/21 105/67    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 -Had underwent recent paracentesis during recent hospitalization at [facility]. -Resume diuretics on 12/23 due to complaining abdominal distention , no lower extremity edema , patient desired to try diuretic first before consider therapeutic paracentesis , she denies abdominal pain , nontender on exam , she has no fever  -she understand she may need to have another paracentesis if ab distension gets worse -Outpatient follow-up with PCP/GI   Intermittent chest pain -Patient continues to complain of intermittent chest pain: Unclear if this is pain medication seeking behavior, less likely cardiac cause of chest pain. -Cardiology evaluated the patient on 08/21/2021: Cardiology doubts that patient's chest pain is ischemic chest pain and recommend no further intervention.   -she reports h/o esophagitis/gastritis -she agreed to gi cocktail, carafate, she states she was on oral morphine for chest pain at home, she requests to restart oral morphine, she agreed to add on holding parameters. 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  Has an IVC filter.  Was noncompliant with anticoagulation and was found to have pulmonary embolism a few days ago when hospitalized in [facility].  Eliquis was resumed   Suicidal ideation. MDD, PTSD -Patient apparently expressed suicidal ideation on 08/20/2021.  Continue suicide precautions and one-to-one sitter.   -Patient has been started on fluoxetine 20 mg daily along with trazodone 25 mg at night as needed for sleep, -Patient is medically stable, has been tolerating fluoxetine, trazodone, abilify. Requesting psychiatry recs re dispo: plan is for discharge to inpatient rehab vs renew/continue IVC. If patient has capacity to refuse treatment in [facility] then can be d/c home.    Homelessness Noncompliance -Child psychotherapist following.  Patient apparently refused to work with PT/OT intermittently. -Patient intermittently refuses medical care including treatments.  Patient keeps asking for morphine orally; apparently she takes this medication at home.  Per  PDMP she does not receive morphine consistently; has only received few pills by various providers intermittently recently.     DVT prophylaxis: Eliquis Code Status: Full Family Communication: None at bedside Disposition Plan: Status is: Inpatient   Remains inpatient appropriate because: Unsafe discharge plan.  Currently IVced, sitter at bedside,  awaiting for behavioral health placement   Consultants: Psychiatry/cardiology   Procedures: None   Antimicrobials: None     Subjective:     She is calm and cooperative She denies ab pain, ab nontender on exam She reports central chest pain, chest wall tenderness on exam, she asks about continuing morphine   bedside sitter in room  Awaiting for [facility]  placement  Objective: Vitals:   08/28/21 1623 08/28/21 1700 08/28/21 2037 08/29/21 0838  BP: 108/76  118/76 105/67  Pulse: 62  84 67  Resp:  17 18   Temp:  98 F (36.7 C) 98.2 F (36.8 C) 98.2 F (36.8 C)  TempSrc:  Oral Oral Oral  SpO2:   95% 93%  Weight:      Height:        Intake/Output Summary (Last 24 hours) at 08/29/2021 1647 Last data filed at 08/29/2021 1430 Gross per 24 hour  Intake 580 ml  Output --  Net 580 ml    Filed Weights   08/26/21 0533 08/27/21 0554 08/28/21 0500  Weight: 50.1 kg 51.6 kg 59.8 kg    Examination:  General exam: Appears calm and comfortable  Respiratory system: Clear to auscultation. Respiratory effort normal. Cardiovascular system: S1 & S2 heard, RRR. No JVD, murmurs, rubs, gallops or clicks. No pedal edema. Gastrointestinal system: Abdomen is nondistended, soft and nontender. No organomegaly or masses felt. Normal bowel sounds heard. Central nervous system: Alert and oriented. No focal neurological deficits. Extremities: Symmetric 5 x 5 power. Skin: No rashes, lesions or ulcers Psychiatry: Judgement and insight appear fair. Mood & affect appropriate.     Data Reviewed: I have personally reviewed following labs and imaging studies  CBC: Recent Labs  Lab 08/23/21 0639 08/28/21 0436  WBC 3.5* 3.6*  NEUTROABS 1.8 2.0  HGB 11.3* 10.7*  HCT 35.3* 34.4*  MCV 94.4 94.5  PLT 256 303    Basic Metabolic Panel: Recent Labs  Lab 08/23/21 0639 08/28/21 0436  NA 137 139  K 3.5 4.3  CL 109 109  CO2 23 20*  GLUCOSE 105* 84  BUN 16 15  CREATININE 1.01* 0.96  CALCIUM 10.6* 10.2  MG 1.8 1.8    GFR: Estimated Creatinine Clearance: 52.2 mL/min (by C-G formula based on SCr of 0.96 mg/dL). Liver Function Tests: Recent Labs  Lab 08/23/21 0639 08/28/21 0436  AST 27 31  ALT 17 18  ALKPHOS 146* 135*  BILITOT 1.1 0.7  PROT 6.8 6.5  ALBUMIN 3.3* 3.2*    No results for input(s): LIPASE, AMYLASE in the last 168 hours. No  results for input(s): AMMONIA in the last 168 hours. Coagulation Profile: No results for input(s): INR, PROTIME in the last 168 hours. Cardiac Enzymes: No results for input(s): CKTOTAL, CKMB, CKMBINDEX, TROPONINI in the last 168 hours. BNP (last 3 results) No results for input(s): PROBNP in the last 8760 hours. HbA1C: No results for input(s): HGBA1C in the last 72 hours. CBG: No results for input(s): GLUCAP in the last 168 hours. Lipid Profile: No results for input(s): CHOL, HDL, LDLCALC, TRIG, CHOLHDL, LDLDIRECT in the last 72 hours. Thyroid Function Tests: No results for input(s): TSH, T4TOTAL, FREET4, T3FREE, THYROIDAB in the last 72 hours. Anemia Panel: No results for input(s): VITAMINB12, FOLATE, FERRITIN, TIBC, IRON, RETICCTPCT in the last 72 hours. Urine analysis:    Component Value Date/Time   COLORURINE YELLOW 08/19/2021 2328   APPEARANCEUR CLEAR 08/19/2021 2328   LABSPEC <1.005 (L) 08/19/2021 2328   PHURINE 6.0 08/19/2021 2328   GLUCOSEU NEGATIVE 08/19/2021 2328   HGBUR NEGATIVE 08/19/2021 2328   BILIRUBINUR NEGATIVE 08/19/2021 2328   KETONESUR NEGATIVE 08/19/2021 2328   PROTEINUR NEGATIVE 08/19/2021 2328   NITRITE NEGATIVE 08/19/2021 2328   LEUKOCYTESUR NEGATIVE 08/19/2021 2328   Sepsis Labs: @LABRCNTIP (procalcitonin:4,lacticidven:4)  ) No results found for this or any previous visit (from the past 240 hour(s)).        Radiology Studies: No results found.      Scheduled Meds:  apixaban  5 mg Oral BID   ARIPiprazole  2 mg Oral Daily   feeding supplement  237 mL Oral BID BM   ferrous sulfate  325 mg Oral Q48H   FLUoxetine  10 mg Oral Daily   folic acid  1 mg Oral Daily   lactulose  10 g Oral BID   latanoprost  1 drop Both Eyes QHS   metoprolol tartrate  12.5 mg Oral BID   multivitamin with minerals  1 tablet Oral Daily   pantoprazole  20 mg Oral Daily   spironolactone  25 mg Oral Daily   sucralfate  1 g Oral TID WC & HS   thiamine  100 mg Oral  Daily   torsemide  10 mg Oral Daily   Continuous Infusions:   LOS: 9 days    Time spent: 35 min    Emeterio Reeve, MD [facility] Pager [phone]  If 7PM-7AM, please contact night-coverage [email or web address] Password [facility] 08/29/2021, 4:47 PM

## 2021-08-30 DIAGNOSIS — F332 Major depressive disorder, recurrent severe without psychotic features: Secondary | ICD-10-CM | POA: Diagnosis not present

## 2021-08-30 DIAGNOSIS — I959 Hypotension, unspecified: Secondary | ICD-10-CM | POA: Diagnosis not present

## 2021-08-30 LAB — BASIC METABOLIC PANEL
Anion gap: 8 (ref 5–15)
BUN: 18 mg/dL (ref 6–20)
CO2: 18 mmol/L — ABNORMAL LOW (ref 22–32)
Calcium: 10.4 mg/dL — ABNORMAL HIGH (ref 8.9–10.3)
Chloride: 109 mmol/L (ref 98–111)
Creatinine, Ser: 1.12 mg/dL — ABNORMAL HIGH (ref 0.44–1.00)
GFR, Estimated: 57 mL/min — ABNORMAL LOW (ref >60)
Glucose, Bld: 114 mg/dL — ABNORMAL HIGH (ref 70–99)
Potassium: 4 mmol/L (ref 3.5–5.1)
Sodium: 135 mmol/L (ref 135–145)

## 2021-08-30 MED ORDER — MELATONIN 5 MG PO TABS
5.0000 mg | ORAL_TABLET | Freq: Every day | ORAL | Status: DC
  Administered 2021-08-30 – 2021-08-31 (×2): 5 mg via ORAL
  Filled 2021-08-30 (×7): qty 1

## 2021-08-30 MED ORDER — MELATONIN 3 MG PO TABS: 3.0000 mg | ORAL_TABLET | Freq: Every day | ORAL | Status: DC
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
## 2021-08-30 NOTE — TOC Progression Note (Signed)
Transition of Care [facility]) - Progression Note    Patient Details  Name: Adrienne Villarreal MRN: [id number] Date of Birth: 25-Dec-1960  Transition of Care [facility]) CM/SW [place], [place] Phone Number: 08/30/2021, 2:35 PM  Clinical Narrative:     CSW was notified by Psych that pt is interested in substance use treatment. CSW met with pt at bedside and provided resources. CSW and pt dicussed inpatient and outpatient treatment. Pt states she needs to get help as she does not think she can survive another relapse. She stated she would review options and reach out to the ones she is interested in. She will let CSW know if any paperwork needs to be sent from the hospital. [facility] will continue to follow for any DC needs.       Expected Discharge Plan and Services                                                 Social Determinants of Health (SDOH) Interventions    Readmission Risk Interventions No flowsheet data found.

## 2021-08-30 NOTE — Progress Notes (Signed)
PROGRESS NOTE    Adrienne Villarreal  [id number] DOB: [birth year] DOA: 08/18/2021 PCP: Pcp, No   Brief Narrative: 60 year old with past medical history significant for alcohol abuse, alcoholic cirrhosis, chronic systolic heart failure, A. fib, PE on Eliquis, homelessness recent admission from 08/10/2021 until 08/18/2021 at [facility] for cirrhosis with ascites requiring paracentesis and diuresis was also found to have PE for which she was restarted on Eliquis.  She was discharged to alcohol rehab center 08/18/2021, because she was ambulating with a walker, she could not get into the center and she was driven to a homeless shelter.  The shelter did not had any bed and patient was directed to the ED.    On presentation she was hypotensive with systolic blood pressure in the 70s and 80s.  She was complaining of ongoing chest pain.  She received IV fluids.  She was found to have a creatinine of 2.0.  Prior creatinine 1.2. She was initially evaluated by psych who IVC her and recommended inpatient psych facility.  Subsequently she improved and now has been cleared by psych.  Patient does not need inpatient psychiatric facility admission.  Okay to DC sitter per psych   Assessment & Plan:   Principal Problem:   Hypotension Active Problems:   AKI (acute kidney injury) ([place])   Atrial fibrillation, chronic (HCC)   Alcoholic cirrhosis (HCC)   Prolonged QT interval   Chronic anticoagulation   Noncompliance   Pulmonary embolism (HCC)   Chronic systolic CHF (congestive heart failure) (HCC)   MDD (major depressive disorder), recurrent episode, severe (HCC)   PTSD (post-traumatic stress disorder)   Atypical chest pain   Malnutrition of moderate degree  1-AKI; Patient presented with a creatinine of 2.3.  Renal function improved with fluids.  Initially her diuretics were on hold. Creatinine has remained stable on diuretics.  2-Hypotension: Likely due to underlying cirrhosis Tolerating  low-dose metoprolol Continue with diuretics  3-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 -Recently underwent paracentesis at [place] with diuretic -Monitor for need of paracentesis  4-intermittent chest pain: Cardiology evaluated patient 08/21/2021.  Cardiology doubts that patient chest pain is ischemic in nature.  No further intervention. She is on morphine as an outpatient.  Chronic systolic heart failure: Monitor on diuretics  A. 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  She has an IVC filter.  She was not compliant with anticoagulation and was found to have PE few days prior to [facility].  Continue with Eliquis  Suicidal ideation, MDD, PTSD: See report for suicidal ideation 08/20/2021. Evaluated by psych initially recommended inpatient psychiatric facility. Related by psych 08/2017 has been clear.  Patient does not need inpatient psychiatric facility does not need a sitter  Homelessness: Plan to get PT OT social worker consulted     Nutrition Problem: Moderate Malnutrition Etiology: chronic illness (EtOH abuse, cirrhosis, CHF)    Signs/Symptoms: mild fat depletion, moderate fat depletion, mild muscle depletion, moderate muscle depletion, severe muscle depletion    Interventions: Ensure Enlive (each supplement provides 350kcal and 20 grams of protein), MVI, Liberalize Diet  Estimated body mass index is 23.35 kg/m as calculated from the following:   Height as of this encounter: 5\' 3"  (1.6 m).   Weight as of this encounter: 59.8 kg.   DVT prophylaxis: Eliquis Code Status: Full code Family Communication: care discussed with patient Disposition Plan:  Status is: Inpatient  Remains inpatient appropriate because: Plan to  get PT ot, needs disposition. Clear by psych         Consultants:  Psych  Procedures:    Antimicrobials:    Subjective: She is alert, does  not feels well, does not further elaborate,.    Objective: Vitals:   08/29/21 0838 08/29/21 1816 08/29/21 2100 08/30/21 1021  BP: 105/67 110/71 110/77 93/64  Pulse: 67 68  66  Resp:  18  20  Temp: 98.2 F (36.8 C) 98 F (36.7 C)  97.9 F (36.6 C)  TempSrc: Oral Oral  Oral  SpO2: 93% 95%  96%  Weight:      Height:        Intake/Output Summary (Last 24 hours) at 08/30/2021 1046 Last data filed at 08/30/2021 [id number] Gross per 24 hour  Intake 720 ml  Output --  Net 720 ml   Filed Weights   08/26/21 0533 08/27/21 0554 08/28/21 0500  Weight: 50.1 kg 51.6 kg 59.8 kg    Examination:  General exam: Appears calm and comfortable  Respiratory system: Clear to auscultation. Respiratory effort normal. Cardiovascular system: S1 & S2 heard, RRR. No JVD, murmurs, rubs, gallops or clicks. No pedal edema. Gastrointestinal system: Abdomen is nondistended, soft and nontender. No organomegaly or masses felt. Normal bowel sounds heard. Central nervous system: Alert and oriented. No focal neurological deficits. Extremities: Symmetric 5 x 5 power. Skin: No rashes, lesions or ulcers  Data Reviewed: I have personally reviewed following labs and imaging studies  CBC: Recent Labs  Lab 08/28/21 0436  WBC 3.6*  NEUTROABS 2.0  HGB 10.7*  HCT 34.4*  MCV 94.5  PLT [id number]   Basic Metabolic Panel: Recent Labs  Lab 08/28/21 0436  NA 139  K 4.3  CL 109  CO2 20*  GLUCOSE 84  BUN 15  CREATININE 0.96  CALCIUM 10.2  MG 1.8   GFR: Estimated Creatinine Clearance: 52.2 mL/min (by C-G formula based on SCr of 0.96 mg/dL). Liver Function Tests: Recent Labs  Lab 08/28/21 0436  AST 31  ALT 18  ALKPHOS 135*  BILITOT 0.7  PROT 6.5  ALBUMIN 3.2*   No results for input(s): LIPASE, AMYLASE in the last 168 hours. No results for input(s): AMMONIA in the last 168 hours. Coagulation Profile: No results for input(s): INR, PROTIME in the last 168 hours. Cardiac Enzymes: No results for input(s):  CKTOTAL, CKMB, CKMBINDEX, TROPONINI in the last 168 hours. BNP (last 3 results) No results for input(s): PROBNP in the last 8760 hours. HbA1C: No results for input(s): HGBA1C in the last 72 hours. CBG: No results for input(s): GLUCAP in the last 168 hours. Lipid Profile: No results for input(s): CHOL, HDL, LDLCALC, TRIG, CHOLHDL, LDLDIRECT in the last 72 hours. Thyroid Function Tests: No results for input(s): TSH, T4TOTAL, FREET4, T3FREE, THYROIDAB in the last 72 hours. Anemia Panel: No results for input(s): VITAMINB12, FOLATE, FERRITIN, TIBC, IRON, RETICCTPCT in the last 72 hours. Sepsis Labs: No results for input(s): PROCALCITON, LATICACIDVEN in the last 168 hours.  No results found for this or any previous visit (from the past 240 hour(s)).       Radiology Studies: No results found.      Scheduled Meds:  apixaban  5 mg Oral BID   ARIPiprazole  2 mg Oral Daily   feeding supplement  237 mL Oral BID BM   ferrous sulfate  325 mg Oral Q48H   FLUoxetine  10 mg Oral Daily   folic acid  1 mg Oral Daily   lactulose  10 g Oral BID   latanoprost  1 drop Both Eyes QHS   metoprolol tartrate  12.5 mg Oral BID   multivitamin with minerals  1 tablet Oral Daily   pantoprazole  20 mg Oral Daily   spironolactone  25 mg Oral Daily   sucralfate  1 g Oral TID WC & HS   thiamine  100 mg Oral Daily   torsemide  10 mg Oral Daily   Continuous Infusions:   LOS: 10 days    Time spent: 35 minutes    Lovelle Deitrick A Junie Engram, MD [facility]   If 7PM-7AM, please contact night-coverage [email or web address]  08/30/2021, 10:46 AM

## 2021-08-30 NOTE — Plan of Care (Signed)
  Problem: Health Behavior/Discharge Planning: Goal: Ability to manage health-related needs will improve Outcome: Progressing   

## 2021-08-31 DIAGNOSIS — I959 Hypotension, unspecified: Secondary | ICD-10-CM | POA: Diagnosis not present

## 2021-08-31 MED ORDER — ONDANSETRON HCL 4 MG/2ML IJ SOLN
4.0000 mg | Freq: Once | INTRAMUSCULAR | Status: AC
  Administered 2021-08-31: 4 mg via INTRAVENOUS
  Filled 2021-08-31: qty 2

## 2021-08-31 NOTE — Progress Notes (Signed)
PROGRESS NOTE    Francine Edes  [id number] DOB: [birth year] DOA: 08/18/2021 PCP: Pcp, No   Brief Narrative: 60 year old with past medical history significant for alcohol abuse, alcoholic cirrhosis, chronic systolic heart failure, A. fib, PE on Eliquis, homelessness recent admission from 08/10/2021 until 08/18/2021 at [facility] for cirrhosis with ascites requiring paracentesis and diuresis was also found to have PE for which she was restarted on Eliquis.  She was discharged to alcohol rehab center 08/18/2021, because she was ambulating with a walker, she could not get into the center and she was driven to a homeless shelter.  The shelter did not had any bed and patient was directed to the ED.    On presentation she was hypotensive with systolic blood pressure in the 70s and 80s.  She was complaining of ongoing chest pain.  She received IV fluids.  She was found to have a creatinine of 2.0.  Prior creatinine 1.2. She was initially evaluated by psych who IVC her and recommended inpatient psych facility.  Subsequently she improved and now has been cleared by psych.  Patient does not need inpatient psychiatric facility admission.  Okay to DC sitter per psych   Assessment & Plan:   Principal Problem:   Hypotension Active Problems:   AKI (acute kidney injury) ([place])   Atrial fibrillation, chronic (HCC)   Alcoholic cirrhosis (HCC)   Prolonged QT interval   Chronic anticoagulation   Noncompliance   Pulmonary embolism (HCC)   Chronic systolic CHF (congestive heart failure) (HCC)   MDD (major depressive disorder), recurrent episode, severe (HCC)   PTSD (post-traumatic stress disorder)   Atypical chest pain   Malnutrition of moderate degree  1-AKI; Patient presented with a creatinine of 2.3.  Renal function improved with fluids.  Initially her diuretics were on hold. Creatinine has remained stable on diuretics. Stable.   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  Cardiology doubts that patient chest pain is ischemic in nature.  No further intervention. She is on morphine as an outpatient.  Chronic systolic heart failure: Monitor on diuretics  A. 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  She has an IVC filter.  She was not compliant with anticoagulation and was found to have PE few days prior to [facility].  Continue with Eliquis  Suicidal ideation, MDD, PTSD: See report for suicidal ideation 08/20/2021. Evaluated by psych initially recommended inpatient psychiatric facility. Related by psych 08/2017 has been clear.  Patient does not need inpatient psychiatric facility does not need a sitter  Homelessness: Plan to get PT OT social worker consulted     Nutrition Problem: Moderate Malnutrition Etiology: chronic illness (EtOH abuse, cirrhosis, CHF)    Signs/Symptoms: mild fat depletion, moderate fat depletion, mild muscle depletion, moderate muscle depletion, severe muscle depletion    Interventions: Ensure Enlive (each supplement provides 350kcal and 20 grams of protein), MVI, Liberalize Diet  Estimated body mass index is 23.63 kg/m as calculated from the following:   Height as of this encounter: 5\' 3"  (1.6 m).   Weight as of this encounter: 60.5 kg.   DVT prophylaxis: Eliquis Code Status: Full code Family Communication: care discussed with patient Disposition Plan:  Status  is: Inpatient  Remains inpatient appropriate because: Plan to get PT ot, needs disposition. Clear by psych , stable for discharge, awaiting disposition          Consultants:  Psych  Procedures:    Antimicrobials:    Subjective: Alert, refuse meds.   Objective: Vitals:   08/30/21 2201 08/31/21 0533 08/31/21 0656 08/31/21 0936  BP: 100/70 119/82  121/64  Pulse: 83 72  66  Resp: 18 18  16   Temp: 98.2 F (36.8 C) 97.9 F (36.6 C)  98.4 F (36.9 C)  TempSrc: Oral Oral  Oral  SpO2: 95% 94%  93%  Weight: 60.5 kg  60.5 kg   Height:        Intake/Output Summary (Last 24 hours) at 08/31/2021 1438 Last data filed at 08/31/2021 0900 Gross per 24 hour  Intake 340 ml  Output --  Net 340 ml    Filed Weights   08/28/21 0500 08/30/21 2201 08/31/21 0656  Weight: 59.8 kg 60.5 kg 60.5 kg    Examination:  General exam: NAD Respiratory system: CTA Cardiovascular system: S 1, S 2 RRR Gastrointestinal system: BS present, soft, nt Central nervous system: alert Extremities: no edema   Data Reviewed: I have personally reviewed following labs and imaging studies  CBC: Recent Labs  Lab 08/28/21 0436  WBC 3.6*  NEUTROABS 2.0  HGB 10.7*  HCT 34.4*  MCV 94.5  PLT [id number]    Basic Metabolic Panel: Recent Labs  Lab 08/28/21 0436 08/30/21 1106  NA 139 135  K 4.3 4.0  CL 109 109  CO2 20* 18*  GLUCOSE 84 114*  BUN 15 18  CREATININE 0.96 1.12*  CALCIUM 10.2 10.4*  MG 1.8  --     GFR: Estimated Creatinine Clearance: 44.7 mL/min (A) (by C-G formula based on SCr of 1.12 mg/dL (H)). Liver Function Tests: Recent Labs  Lab 08/28/21 0436  AST 31  ALT 18  ALKPHOS 135*  BILITOT 0.7  PROT 6.5  ALBUMIN 3.2*    No results for input(s): LIPASE, AMYLASE in the last 168 hours. No results for input(s): AMMONIA in the last 168 hours. Coagulation Profile: No results for input(s): INR, PROTIME in the last 168 hours. Cardiac Enzymes: No results for input(s): CKTOTAL, CKMB, CKMBINDEX, TROPONINI in the last 168 hours. BNP (last 3 results) No results for input(s): PROBNP in the last 8760 hours. HbA1C: No results for  input(s): HGBA1C in the last 72 hours. CBG: No results for input(s): GLUCAP in the last 168 hours. Lipid Profile: No results for input(s): CHOL, HDL, LDLCALC, TRIG, CHOLHDL, LDLDIRECT in the last 72 hours. Thyroid Function Tests: No results for input(s): TSH, T4TOTAL, FREET4, T3FREE, THYROIDAB in the last 72 hours. Anemia Panel: No results for input(s): VITAMINB12, FOLATE, FERRITIN, TIBC, IRON, RETICCTPCT in the last 72 hours. Sepsis Labs: No results for input(s): PROCALCITON, LATICACIDVEN in the last 168 hours.  No results found for this or any previous visit (from the past 240 hour(s)).       Radiology Studies: No results found.      Scheduled Meds:  apixaban  5 mg Oral BID   ARIPiprazole  2 mg Oral Daily   feeding supplement  237 mL Oral BID BM   ferrous sulfate  325 mg Oral [id number]   folic acid  1 mg Oral Daily   lactulose  10 g Oral BID   latanoprost  1 drop Both Eyes QHS   melatonin  5 mg Oral QHS   metoprolol  tartrate  12.5 mg Oral BID   multivitamin with minerals  1 tablet Oral Daily   pantoprazole  20 mg Oral Daily   spironolactone  25 mg Oral Daily   sucralfate  1 g Oral TID WC & HS   thiamine  100 mg Oral Daily   torsemide  10 mg Oral Daily   Continuous Infusions:   LOS: 11 days    Time spent: 35 minutes    Thurley Francesconi A Nana Vastine, MD [facility]   If 7PM-7AM, please contact night-coverage [email or web address]  08/31/2021, 2:38 PM

## 2021-08-31 NOTE — Progress Notes (Signed)
OT Cancellation Note  Patient Details Name: Adrienne Villarreal MRN: [id number] DOB: [birth year]   Cancelled Treatment:    Reason Eval/Treat Not Completed: Patient declined, no reason specified (Pt stated. I"m too sleepy right now, I don't need anything")  Galen Manila 08/31/2021, 12:28 PM

## 2021-08-31 NOTE — Progress Notes (Signed)
PT Cancellation Note  Patient Details Name: Adrienne Villarreal MRN: [id number] DOB: 06/02/1961   Cancelled Treatment:    Reason Eval/Treat Not Completed: Patient declined, no reason specified. Pt refusing, stating "I'm trying to eat right now" and "I don't have no problems walking with my walker." PT will continue to attempt PT evaluation as available and appropriate.    Alessandra Bevels Jaydis Duchene 08/31/2021, 10:14 AM

## 2021-08-31 NOTE — Plan of Care (Signed)

## 2021-08-31 NOTE — Plan of Care (Signed)
°  Problem: Clinical Measurements: °Goal: Respiratory complications will improve °Outcome: Progressing °  °Problem: Clinical Measurements: °Goal: Cardiovascular complication will be avoided °Outcome: Progressing °  °Problem: Activity: °Goal: Risk for activity intolerance will decrease °Outcome: Progressing °  °

## 2021-09-01 ENCOUNTER — Other Ambulatory Visit (HOSPITAL_COMMUNITY): Payer: Self-pay

## 2021-09-01 DIAGNOSIS — I959 Hypotension, unspecified: Secondary | ICD-10-CM | POA: Diagnosis not present

## 2021-09-01 MED ORDER — THIAMINE HCL 100 MG PO TABS
100.0000 mg | ORAL_TABLET | Freq: Every day | ORAL | 0 refills | Status: AC
  Filled 2021-09-01: qty 30, 30d supply, fill #0

## 2021-09-01 MED ORDER — SUCRALFATE 1 GM/10ML PO SUSP
1.0000 g | Freq: Three times a day (TID) | ORAL | 0 refills | Status: AC
  Filled 2021-09-01: qty 420, 11d supply, fill #0

## 2021-09-01 MED ORDER — LACTULOSE ENCEPHALOPATHY 10 GM/15ML PO SOLN
10.0000 g | Freq: Two times a day (BID) | ORAL | 0 refills | Status: AC
  Filled 2021-09-01: qty 236, 8d supply, fill #0

## 2021-09-01 MED ORDER — MELATONIN 5 MG PO TABS
5.0000 mg | ORAL_TABLET | Freq: Every day | ORAL | 0 refills | Status: AC
  Filled 2021-09-01: qty 30, 30d supply, fill #0

## 2021-09-01 MED ORDER — FOLIC ACID 1 MG PO TABS
1.0000 mg | ORAL_TABLET | Freq: Every day | ORAL | 0 refills | Status: AC
  Filled 2021-09-01: qty 30, 30d supply, fill #0

## 2021-09-01 MED ORDER — PROMETHAZINE HCL 25 MG PO TABS
25.0000 mg | ORAL_TABLET | Freq: Once | ORAL | Status: DC
  Filled 2021-09-01: qty 1

## 2021-09-01 MED ORDER — METOPROLOL TARTRATE 25 MG PO TABS
12.5000 mg | ORAL_TABLET | Freq: Two times a day (BID) | ORAL | 0 refills | Status: AC
  Filled 2021-09-01: qty 60, 60d supply, fill #0

## 2021-09-01 MED ORDER — APIXABAN 5 MG PO TABS
5.0000 mg | ORAL_TABLET | Freq: Two times a day (BID) | ORAL | 1 refills | Status: AC
  Filled 2021-09-01: qty 60, 30d supply, fill #0

## 2021-09-01 MED ORDER — ARIPIPRAZOLE 2 MG PO TABS
2.0000 mg | ORAL_TABLET | Freq: Every day | ORAL | 2 refills | Status: AC
  Filled 2021-09-01: qty 30, 30d supply, fill #0

## 2021-09-01 NOTE — Consult Note (Signed)
[facility] Face-to-Face Psychiatry Follow Up Consult   Patient Identification: Adrienne Villarreal MRN:  [id number] Principal Diagnosis: Hypotension Diagnosis:  Principal Problem:   Hypotension Active Problems:   AKI (acute kidney injury) ([place])   Atrial fibrillation, chronic (HCC)   Alcoholic cirrhosis (HCC)   Prolonged QT interval   Chronic anticoagulation   Noncompliance   Pulmonary embolism (HCC)   Chronic systolic CHF (congestive heart failure) (HCC)   MDD (major depressive disorder), recurrent episode, severe (HCC)   PTSD (post-traumatic stress disorder)   Atypical chest pain   Malnutrition of moderate degree   Total Time spent with patient: 20 minutes  Patient is 60 year old female with a history of depression, PTSD, anxiety alcohol abuse, cirrhosis, CHF.She is homeless and recently had admission to [facility] for ascites, PE.  She was brought to the emergency room for atypical chest pain after being unable to find a place at homeless shelter.  She was also had low blood pressure.  Psychiatry was consulted for suicidal ideation.  She tried to leave AMA and she was IVC.  She was on 1-1 sitter.  09/01/2021: Patient seen and chart reviewed.  Patient continues to do better denies any suicidal thoughts.  She is taking Abilify 2 mg but preferred to take nighttime as she feel more sleepy during the day after taking Abilify.  She is not agitated and cooperative with the staff.  She is hoping to get accepted at drug rehab program.  She reported her social worker is working on multiple programs and she is waiting to get accepted.  She denies any crying spells.  She denies any suicidal thoughts, hallucination or any paranoia.  She was able to talk to her daughter but her daughter was going out of the town so she did not talk much.  She reported no side effects from the psychotropic medication.  She has not received any medication for agitation and as per staff no complaint of aggression, violence or  outbursts behavior.   08/30/2021; Patient seen and chart reviewed.  Patient doing better on her medication.  We started her on Abilify 2 mg and she noticed much improvement in her mood.  She is not taking Prozac and she does not feel trazodone helped her sleep.  She does not want to take excessive medication.  She denies any suicidal thoughts and her behavior is more calm.  She did not have any agitation and as per staff not violent, threatening.  She denies any hallucination or any paranoia.  In the past she had taken melatonin that helps her sleep.  She preferred to take melatonin if available for sleep.  She admitted her depression is much better and she has no longer any suicidal thoughts but like to have rehabilitation upon discharge.    08/28/21; Patient seen today and chart reviewed. She continues to endorse suicidal thoughts remain depressed.  She did not sleep last night and continues to have bad dreams.  She admitted feeling sad and depressed because of limited family support.  She remembers her mother especially around Christmas time she feels very isolated, lonely.  However she also reported not to think about to die but these thoughts comes on and off and she is not sure if she can control these thoughts.  She admitted there are thoughts recently when she wanted to hang herself.  She denies any hallucination, paranoia.  She is on fluoxetine, trazodone.  She had tried in the past multiple medication but admitted not continuing to be treated  side effects.  She has been noncompliant with medications and follow-up before coming to the hospital.  She is hoping to talk to the social worker today to find a place.  She remains very guarded about her family members and relationship.  She remains guarded about her symptoms.  As per staff no agitation or aggressive behavior.  Past Psychiatric History: Please see initial evaluation for full details. I have reviewed the history. No updates at this time.      Risk to Self:   Risk to Others:   Prior Inpatient Therapy:   Prior Outpatient Therapy:    Past Medical History: History reviewed. No pertinent past medical history. History reviewed. No pertinent surgical history. Family History: History reviewed. No pertinent family history. Family Psychiatric  History: Please see initial evaluation for full details. I have reviewed the history. No updates at this time.    Social History:  Social History   Substance and Sexual Activity  Alcohol Use None     Social History   Substance and Sexual Activity  Drug Use Not on file    Social History   Socioeconomic History   Marital status: Single    Spouse name: Not on file   Number of children: Not on file   Years of education: Not on file   Highest education level: Not on file  Occupational History   Not on file  Tobacco Use   Smoking status: Never    Passive exposure: Past   Smokeless tobacco: Never  Substance and Sexual Activity   Alcohol use: Not on file   Drug use: Not on file   Sexual activity: Not on file  Other Topics Concern   Not on file  Social History Narrative   Not on file   Social Determinants of Health   Financial Resource Strain: Not on file  Food Insecurity: Not on file  Transportation Needs: Not on file  Physical Activity: Not on file  Stress: Not on file  Social Connections: Not on file   Additional Social History:    Allergies:   Allergies  Allergen Reactions   Acetaminophen Other (See Comments)    Patient has been instructed to avoid due to cirrhosis    Ampicillin Anaphylaxis, Shortness Of Breath, Swelling and Other (See Comments)    Comment from [facility] records:  prescribed Augmentin 06/2017 but refused to take it, cefazolin 05/2016, Keflex 05/2016 Tolerated Ancef June 2021    Contrast Media [Iodinated Contrast Media] Anaphylaxis, Itching and Swelling    Pharyngeal swelling   Haloperidol Shortness Of Breath, Swelling, Anxiety and Other (See  Comments)    Drug-induced dystonia. Patient states it makes her have "stroke like symptoms". "Body twisted up, had to be on Cogentin for 2 weeks."   Hydrocodone Nausea And Vomiting and Shortness Of Breath    Tolerates morphine   Hydroxyzine Nausea And Vomiting, Other (See Comments) and Palpitations    "slows my breathing"    Lisinopril Shortness Of Breath and Other (See Comments)    Cough    Prochlorperazine Itching, Rash and Other (See Comments)    Dystonia per RN, patient takes promethazine at home without any issues per patient    Quetiapine Anaphylaxis, Swelling and Other (See Comments)    Swelling of throat. Edema of pharynx    Sulfamethoxazole-Trimethoprim Shortness Of Breath   Carbamazepine Rash   Clindamycin Itching and Nausea And Vomiting        Fentanyl Nausea And Vomiting and Rash    Reports breaking  out on chest and itching Pt states she tolerates morphine    Fluoxetine Other (See Comments)    Pt states this medication makes her "feel weird and lethargic".    Ondansetron Nausea And Vomiting   Peanut-Containing Drug Products Nausea And Vomiting and Rash    Patient stated that she gets sick.   Tetracyclines & Related Itching and Swelling   Ziprasidone Other (See Comments)    Other reaction(s): SIMPLE CARDIOVASCULAR DIS EXC CHF,ISCHEMIC HEART DIS & HYPERTN   Ibuprofen Nausea And Vomiting   Metoclopramide Nausea And Vomiting    Tolerates promethazine   Oxycodone Nausea And Vomiting    Can tolerate morphine    Labs:  No results found for this or any previous visit (from the past 48 hour(s)).   Current Facility-Administered Medications  Medication Dose Route Frequency Provider Last Rate Last Admin   acetaminophen (TYLENOL) tablet 650 mg  650 mg Oral Q6H PRN Etta Quill, DO       alum & mag hydroxide-simeth (MAALOX/MYLANTA) 200-200-20 MG/5ML suspension 30 mL  30 mL Oral Q6H PRN Florencia Reasons, MD   30 mL at 08/30/21 [id number]   And   lidocaine (XYLOCAINE) 2 % viscous  mouth solution 15 mL  15 mL Oral Q6H PRN Florencia Reasons, MD       apixaban Arne Cleveland) tablet 5 mg  5 mg Oral BID Malvin Johns, MD   5 mg at 09/01/21 [id number]   ARIPiprazole (ABILIFY) tablet 2 mg  2 mg Oral Daily Emeterio Reeve, DO       diphenhydrAMINE (BENADRYL) capsule 25 mg  25 mg Oral Daily PRN Aline August, MD   25 mg at 09/01/21 0917   feeding supplement (ENSURE ENLIVE / ENSURE PLUS) liquid 237 mL  237 mL Oral BID BM Aline August, MD   237 mL at 09/01/21 0919   ferrous sulfate tablet 325 mg  325 mg Oral Q48H Florencia Reasons, MD   325 mg at [id number]   folic acid (FOLVITE) tablet 1 mg  1 mg Oral Daily Heloise Purpura, RPH   1 mg at 09/01/21 0907   lactulose (CHRONULAC) 10 GM/15ML solution 10 g  10 g Oral BID Florencia Reasons, MD   10 g at 08/29/21 2115   latanoprost (XALATAN) 0.005 % ophthalmic solution 1 drop  1 drop Both Eyes QHS Etta Quill, DO   1 drop at 08/31/21 2212   LORazepam (ATIVAN) tablet 0.5 mg  0.5 mg Oral BID PRN Florencia Reasons, MD   0.5 mg at 09/01/21 [id number]   melatonin tablet 5 mg  5 mg Oral QHS Regalado, Belkys A, MD   5 mg at 08/31/21 2154   metoprolol tartrate (LOPRESSOR) tablet 12.5 mg  12.5 mg Oral BID Florencia Reasons, MD   12.5 mg at 09/01/21 [id number]   morphine 10 MG/5ML solution 2.5 mg  2.5 mg Oral Q4H PRN Florencia Reasons, MD   2.5 mg at 08/30/21 [id number]   multivitamin with minerals tablet 1 tablet  1 tablet Oral Daily Vernelle Emerald, MD   1 tablet at 09/01/21 [id number]   naphazoline-glycerin (CLEAR EYES REDNESS) ophth solution 1-2 drop  1-2 drop Both Eyes QID PRN Emeterio Reeve, DO       pantoprazole (PROTONIX) EC tablet 20 mg  20 mg Oral Daily Heloise Purpura, RPH   20 mg at 09/01/21 [id number]   promethazine (PHENERGAN) tablet 25 mg  25 mg Oral Once Vernelle Emerald, MD       senna-docusate (  Senokot-S) tablet 1 tablet  1 tablet Oral QHS PRN Chotiner, Claudean Severance, MD       spironolactone (ALDACTONE) tablet 25 mg  25 mg Oral Daily Albertine Grates, MD   25 mg at 08/29/21 0845   sucralfate (CARAFATE) 1  GM/10ML suspension 1 g  1 g Oral TID WC & HS Albertine Grates, MD       thiamine tablet 100 mg  100 mg Oral Daily Rolan Bucco, MD   100 mg at 09/01/21 2563   torsemide (DEMADEX) tablet 10 mg  10 mg Oral Daily Albertine Grates, MD   10 mg at 09/01/21 0907    Musculoskeletal: Strength & Muscle Tone: within normal limits Gait & Station:  n/a lying in the bed Patient leans: N/A  Psychiatric Specialty Exam:  Presentation  General Appearance: Appropriate for Environment  Eye Contact:Fair  Speech:Clear and Coherent  Speech Volume:Normal  Handedness:Right   Mood and Affect  Mood:Depressed  Affect:Other (comment); Appropriate; Depressed restricted  Thought Process  Thought Processes:Coherent  Descriptions of Associations:Intact  Orientation:Full (Time, Place and Person)  Thought Content:No data recorded no paranoia History of Schizophrenia/Schizoaffective disorder:No data recorded Duration of Psychotic Symptoms:No data recorded Hallucinations:No data recorded Ideas of Reference:None  Suicidal Thoughts: No     Homicidal Thoughts:No data recorded denies  Sensorium  Memory:Immediate Good  Judgment:Poor  Insight:Present   Executive Functions  Concentration:Good  Attention Span:Good  Recall:Good  Fund of Knowledge:Good  Language:Good   Psychomotor Activity  Psychomotor Activity:No data recorded  Assets  Assets:Desire for Improvement   Sleep  Sleep:No data recorded  Physical Exam: Physical Exam Review of Systems  Psychiatric/Behavioral:  Negative for hallucinations, memory loss and substance abuse. The patient is nervous/anxious.   All other systems reviewed and are negative. Blood pressure 111/71, pulse 71, temperature 97.6 F (36.4 C), temperature source Oral, resp. rate 18, height 5\' 3"  (1.6 m), weight 58.9 kg, SpO2 100 %. Body mass index is 23 kg/m.  Treatment Plan Summary: # MDD, recurrent, severe Patient symptoms are improving and she is no longer  having crying spells.  Consider switching Abilify 2 mg at bedtime as morning dose making her sleepy.  # PTSD Stable.  Does not feel worsening of nightmares and flashback.   # SI Patient no more having suicidal thoughts or any plan.  She reported Abilify helped her depression.  # Alcohol use disorder Patient is in the process of getting rehab program.  She is working closely with .   Recommendation: Patient does not need inpatient services and does not meet criteria for IVC.  Patient can be discharged to drug rehab program and to continue outpatient services for medication management.  Psychiatry will sign off however if symptoms started to recur then please consider to call Child psychotherapist or reconsult.  [place], MD 09/01/2021 2:43 PM

## 2021-09-01 NOTE — Plan of Care (Signed)

## 2021-09-01 NOTE — Progress Notes (Addendum)
HOSPITAL MEDICINE OVERNIGHT EVENT NOTE    Patient complaining of nausea, states that Zofran does not help.  Patient states that Phenergan has worked for her in the past and is requesting this.  Chart reviewed, last EKG reveals a slightly prolonged QTC of 497.  Repeat EKG obtained revealing a QTC of 462.   Providing patient with a one-time dose of 25 mg of oral Phenergan.  Marinda Elk  MD [facility]   ADDENDUM 6am  Patient is now refusing the phenergan saying she has changed her mind.  Deno Lunger Halana Deisher

## 2021-09-01 NOTE — TOC Progression Note (Signed)
Transition of Care [facility]) - Progression Note    Patient Details  Name: Adrienne Villarreal MRN: [id number] Date of Birth: October 21, 1960  [facility] CM/SW Contact  Tom-Johnson, Hershal Coria, RN Phone Number: 09/01/2021, 5:05 PM  Clinical Narrative:    CM notified by MD that patient is medically ready for discharge. CSW had given patient list of facilities to look at and call to see which one can accept her. CM followup with patient and she states all the facilities she had called are asking her for some money. CM spoke with patient about shelters and patient declined stating they will put her out during the daytime and it's cold outside. CM called several rehab facilities as listed below: 1, [facility]- [phone]- Do not have anything available till after 1-2 weeks. 2, [facility]- [phone]- Do not have opening for female treatment. 3, [facility]- [phone]- Requested discharge summary faxed and then said they are full after receiving summary. 4, [facility]- [phone]- No beds until mid January. 5, [facility]- [phone]- No bed available. 6, [facility]- [phone]- Patient needs to change her medicaid from [facility] to [place] for them to assess. 7, [facility]- [phone]- They have beds available. Faxed d/c summary, PT notes and Psych notes to [phone]. Awaits response.  CM notified both TOC supervisors of difficult disposition. Dr. Waymon Amato, Physician advisor also notified.  CM will continue to follow with needs.         Expected Discharge Plan and Services           Expected Discharge Date: 09/01/21                                     Social Determinants of Health (SDOH) Interventions    Readmission Risk Interventions No flowsheet data found.

## 2021-09-01 NOTE — Progress Notes (Signed)
CSW assisting with case and contacted [facility] treatment center. They do not have beds available (detox only) and provided CSW with [facility] in [place] contact, [place], [phone]. CSW left voicemail requesting a call back.  Joaquin Courts, MSW, [facility]

## 2021-09-01 NOTE — Discharge Summary (Signed)
Physician Discharge Summary  Adrienne Villarreal NAT:[id number] DOB: 15-Feb-1961 DOA: 08/18/2021  PCP: Pcp, No  Admit date: 08/18/2021 Discharge date: 09/01/2021  Admitted From: Homeless Disposition:  Homeless.   Recommendations for Outpatient Follow-up:  Follow up with PCP in 1-2 weeks Please obtain BMP/CBC in one week   Discharge Condition: Stable.  CODE STATUS:Full code Diet recommendation: Heart Healthy   Brief/Interim Summary: 60 year old with past medical history significant for alcohol abuse, alcoholic cirrhosis, chronic systolic heart failure, A. fib, PE on Eliquis, homelessness recent admission from 08/10/2021 until 08/18/2021 at [facility] for cirrhosis with ascites requiring paracentesis and diuresis was also found to have PE for which she was restarted on Eliquis.  She was discharged to alcohol rehab center 08/18/2021, because she was ambulating with a walker, she could not get into the center and she was driven to a homeless shelter.  The shelter did not had any bed and patient was directed to the ED.     On presentation she was hypotensive with systolic blood pressure in the 70s and 80s.  She was complaining of ongoing chest pain.  She received IV fluids.  She was found to have a creatinine of 2.0.  Prior creatinine 1.2. She was initially evaluated by psych who IVC her and recommended inpatient psych facility.  Subsequently she improved and now has been cleared by psych.  Patient does not need inpatient psychiatric facility admission.  Okay to DC sitter per psych   Discharge Diagnoses:  Principal Problem:   Hypotension Active Problems:   AKI (acute kidney injury) (HCC)   Atrial fibrillation, chronic (HCC)   Alcoholic cirrhosis (HCC)   Prolonged QT interval   Chronic anticoagulation   Noncompliance   Pulmonary embolism (HCC)   Chronic systolic CHF (congestive heart failure) (HCC)   MDD (major depressive disorder), recurrent episode, severe (HCC)   PTSD  (post-traumatic stress disorder)   Atypical chest pain   Malnutrition of moderate degree  1-AKI; Patient presented with a creatinine of 2.3.  Renal function improved with fluids.  Initially her diuretics were on hold. Creatinine has remained stable on diuretics. Stable.    2-Hypotension: Likely due to underlying cirrhosis Tolerating low-dose metoprolol Continue with diuretics Stable, advised patient to take med.s  Resolved.   3-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 -Recently underwent paracentesis at [facility]. -Continue with diuretic -No need for paracentesis.    4-intermittent chest pain: Cardiology evaluated patient 08/21/2021.  Cardiology doubts that patient chest pain is ischemic in nature.  No further intervention. She is on morphine as an outpatient.   Chronic systolic heart failure: Monitor on diuretics   A. 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  She has an IVC filter.  She was not compliant with anticoagulation and was found to have PE few days prior to this [facility].  Continue with Eliquis   Suicidal ideation, MDD, PTSD: See report for suicidal ideation 08/20/2021. Evaluated by psych initially recommended inpatient psychiatric facility. Related by psych 08/2017 has been clear.  Patient does not need inpatient psychiatric facility does not need a sitter   Homelessness: Plan to get PT/ OT social worker consulted         Nutrition Problem: Moderate Malnutrition Etiology: chronic illness (EtOH abuse, cirrhosis, CHF)       Signs/Symptoms: mild fat depletion, moderate fat depletion, mild muscle depletion, moderate muscle depletion, severe muscle depletion      Discharge Instructions  Discharge  Instructions     Diet - low sodium heart healthy   Complete by: As directed    Increase activity slowly   Complete by: As directed       Allergies as of 09/01/2021        Reactions   Acetaminophen Other (See Comments)   Patient has been instructed to avoid due to cirrhosis   Ampicillin Anaphylaxis, Shortness Of Breath, Swelling, Other (See Comments)   Comment from [facility] records:  prescribed Augmentin 06/2017 but refused to take it, cefazolin 05/2016, Keflex 05/2016 Tolerated Ancef June 2021   Contrast Media [iodinated Contrast Media] Anaphylaxis, Itching, Swelling   Pharyngeal swelling   Haloperidol Shortness Of Breath, Swelling, Anxiety, Other (See Comments)   Drug-induced dystonia. Patient states it makes her have "stroke like symptoms". "Body twisted up, had to be on Cogentin for 2 weeks."   Hydrocodone Nausea And Vomiting, Shortness Of Breath   Tolerates morphine   Hydroxyzine Nausea And Vomiting, Other (See Comments), Palpitations   "slows my breathing"   Lisinopril Shortness Of Breath, Other (See Comments)   Cough   Prochlorperazine Itching, Rash, Other (See Comments)   Dystonia per RN, patient takes promethazine at home without any issues per patient   Quetiapine Anaphylaxis, Swelling, Other (See Comments)   Swelling of throat. Edema of pharynx   Sulfamethoxazole-trimethoprim Shortness Of Breath   Carbamazepine Rash   Clindamycin Itching, Nausea And Vomiting      Fentanyl Nausea And Vomiting, Rash   Reports breaking out on chest and itching Pt states she tolerates morphine   Fluoxetine Other (See Comments)   Pt states this medication makes her "feel weird and lethargic".   Ondansetron Nausea And Vomiting   Peanut-containing Drug Products Nausea And Vomiting, Rash   Patient stated that she gets sick.   Tetracyclines & Related Itching, Swelling   Ziprasidone Other (See Comments)   Other reaction(s): SIMPLE CARDIOVASCULAR DIS EXC CHF,ISCHEMIC HEART DIS & HYPERTN   Ibuprofen Nausea And Vomiting   Metoclopramide Nausea And Vomiting   Tolerates promethazine   Oxycodone Nausea And Vomiting   Can tolerate morphine        Medication List      STOP taking these medications    metoprolol succinate 25 MG 24 hr tablet Commonly known as: TOPROL-XL       TAKE these medications    apixaban 5 MG Tabs tablet Commonly known as: ELIQUIS Take 1 tablet (5 mg total) by mouth 2 (two) times daily.   ARIPiprazole 2 MG tablet Commonly known as: ABILIFY Take 1 tablet (2 mg total) by mouth daily. Start taking on: September 02, 2021   Cholecalciferol 25 MCG (1000 UT) tablet Take 1,000 Units by mouth daily.   ferrous sulfate 325 (65 FE) MG tablet Take 325 mg by mouth daily with breakfast.   folic acid 1 MG tablet Commonly known as: FOLVITE Take 1 tablet (1 mg total) by mouth daily. Start taking on: September 02, 2021 What changed:  how much to take when to take this   lactulose 10 GM/15ML solution Commonly known as: CHRONULAC Take 15 mLs (10 g total) by mouth 2 (two) times daily. What changed:  how much to take when to take this   latanoprost 0.005 % ophthalmic solution Commonly known as: XALATAN Place 1 drop into both eyes at bedtime.   melatonin 5 MG Tabs Take 1 tablet (5 mg total) by mouth at bedtime.   metoprolol tartrate 25 MG tablet Commonly known as: LOPRESSOR Take 0.5  tablets (12.5 mg total) by mouth 2 (two) times daily. What changed:  how much to take when to take this   pantoprazole 40 MG tablet Commonly known as: PROTONIX Take 40 mg by mouth daily.   spironolactone 25 MG tablet Commonly known as: ALDACTONE Take 25 mg by mouth daily.   sucralfate 1 GM/10ML suspension Commonly known as: CARAFATE Take 10 mLs (1 g total) by mouth 4 (four) times daily -  with meals and at bedtime.   thiamine 100 MG tablet Take 1 tablet (100 mg total) by mouth daily. Start taking on: September 02, 2021 What changed:  how much to take when to take this   torsemide 10 MG tablet Commonly known as: DEMADEX Take 10 mg by mouth 2 (two) times daily with a meal.        Follow-up Information     [place] Follow up.   Why: make a appointment to [place] primary care doctor. They have pharmacy and [place] information: [place] [phone]               Allergies  Allergen Reactions   Acetaminophen Other (See Comments)    Patient has been instructed to avoid due to cirrhosis    Ampicillin Anaphylaxis, Shortness Of Breath, Swelling and Other (See Comments)    Comment from [facility] records:  prescribed Augmentin 06/2017 but refused to take it, cefazolin 05/2016, Keflex 05/2016 Tolerated Ancef June 2021    Contrast Media [Iodinated Contrast Media] Anaphylaxis, Itching and Swelling    Pharyngeal swelling   Haloperidol Shortness Of Breath, Swelling, Anxiety and Other (See Comments)    Drug-induced dystonia. Patient states it makes her have "stroke like symptoms". "Body twisted up, had to be on Cogentin for 2 weeks."   Hydrocodone Nausea And Vomiting and Shortness Of Breath    Tolerates morphine   Hydroxyzine Nausea And Vomiting, Other (See Comments) and Palpitations    "slows my breathing"    Lisinopril Shortness Of Breath and Other (See Comments)    Cough    Prochlorperazine Itching, Rash and Other (See Comments)    Dystonia per RN, patient takes promethazine at home without any issues per patient    Quetiapine Anaphylaxis, Swelling and Other (See Comments)    Swelling of throat. Edema of pharynx    Sulfamethoxazole-Trimethoprim Shortness Of Breath   Carbamazepine Rash   Clindamycin Itching and Nausea And Vomiting        Fentanyl Nausea And Vomiting and Rash    Reports breaking out on chest and itching Pt states she tolerates morphine    Fluoxetine Other (See Comments)    Pt states this medication makes her "feel weird and lethargic".    Ondansetron Nausea And Vomiting   Peanut-Containing Drug Products Nausea And Vomiting and Rash    Patient stated that she gets sick.    Tetracyclines & Related Itching and Swelling   Ziprasidone Other (See Comments)    Other reaction(s): SIMPLE CARDIOVASCULAR DIS EXC CHF,ISCHEMIC HEART DIS & HYPERTN   Ibuprofen Nausea And Vomiting   Metoclopramide Nausea And Vomiting    Tolerates promethazine   Oxycodone Nausea And Vomiting    Can tolerate morphine    Consultations: Psych   Procedures/Studies: DG Chest Port 1 View  Result Date: 08/18/2021 CLINICAL DATA:  Shortness of breath EXAM: PORTABLE CHEST 1 VIEW COMPARISON:  None. FINDINGS: Enlarged cardiac silhouette. Trace left pleural effusion. No focal airspace consolidation. Right  midlung scarring. No pneumothorax. No acute osseous abnormality. IMPRESSION: Cardiomegaly.  Trace left pleural effusion. Electronically Signed   By: Maurine Simmering M.D.   On: 08/18/2021 18:10     Subjective: Tolerating diet.   Discharge Exam: Vitals:   09/01/21 0916 09/01/21 1025  BP: 110/69 111/71  Pulse:    Resp:    Temp:    SpO2:       General: Pt is alert, awake, not in acute distress Cardiovascular: RRR, S1/S2 +, no rubs, no gallops Respiratory: CTA bilaterally, no wheezing, no rhonchi Abdominal: Soft, NT, ND, bowel sounds + Extremities: no edema, no cyanosis    The results of significant diagnostics from this hospitalization (including imaging, microbiology, ancillary and laboratory) are listed below for reference.     Microbiology: No results found for this or any previous visit (from the past 240 hour(s)).   Labs: BNP (last 3 results) No results for input(s): BNP in the last 8760 hours. Basic Metabolic Panel: Recent Labs  Lab 08/28/21 0436 08/30/21 1106  NA 139 135  K 4.3 4.0  CL 109 109  CO2 20* 18*  GLUCOSE 84 114*  BUN 15 18  CREATININE 0.96 1.12*  CALCIUM 10.2 10.4*  MG 1.8  --    Liver Function Tests: Recent Labs  Lab 08/28/21 0436  AST 31  ALT 18  ALKPHOS 135*  BILITOT 0.7  PROT 6.5  ALBUMIN 3.2*   No results for input(s): LIPASE, AMYLASE in  the last 168 hours. No results for input(s): AMMONIA in the last 168 hours. CBC: Recent Labs  Lab 08/28/21 0436  WBC 3.6*  NEUTROABS 2.0  HGB 10.7*  HCT 34.4*  MCV 94.5  PLT 303   Cardiac Enzymes: No results for input(s): CKTOTAL, CKMB, CKMBINDEX, TROPONINI in the last 168 hours. BNP: Invalid input(s): POCBNP CBG: No results for input(s): GLUCAP in the last 168 hours. D-Dimer No results for input(s): DDIMER in the last 72 hours. Hgb A1c No results for input(s): HGBA1C in the last 72 hours. Lipid Profile No results for input(s): CHOL, HDL, LDLCALC, TRIG, CHOLHDL, LDLDIRECT in the last 72 hours. Thyroid function studies No results for input(s): TSH, T4TOTAL, T3FREE, THYROIDAB in the last 72 hours.  Invalid input(s): FREET3 Anemia work up No results for input(s): VITAMINB12, FOLATE, FERRITIN, TIBC, IRON, RETICCTPCT in the last 72 hours. Urinalysis    Component Value Date/Time   COLORURINE YELLOW 08/19/2021 2328   APPEARANCEUR CLEAR 08/19/2021 2328   LABSPEC <1.005 (L) 08/19/2021 2328   PHURINE 6.0 08/19/2021 2328   GLUCOSEU NEGATIVE 08/19/2021 2328   HGBUR NEGATIVE 08/19/2021 2328   BILIRUBINUR NEGATIVE 08/19/2021 2328   KETONESUR NEGATIVE 08/19/2021 2328   PROTEINUR NEGATIVE 08/19/2021 2328   NITRITE NEGATIVE 08/19/2021 2328   LEUKOCYTESUR NEGATIVE 08/19/2021 2328   Sepsis Labs Invalid input(s): PROCALCITONIN,  WBC,  LACTICIDVEN Microbiology No results found for this or any previous visit (from the past 240 hour(s)).   Time coordinating discharge: 40 minutes  SIGNED:   Elmarie Shiley, MD  [facility]

## 2021-09-01 NOTE — Evaluation (Signed)
Physical Therapy Evaluation Patient Details Name: Adrienne Villarreal MRN: [id number] DOB: 01/17/1961 Today's Date: 09/01/2021  History of Present Illness  60 year old female with history of alcohol abuse, alcoholic cirrhosis, chronic systolic CHF, atrial fibrillation and PEs on Eliquis, anxiety, homelessness, recent admission from 08/10/2021-08/18/2021 at [facility] for cirrhosis with ascites requiring paracentesis and diuresis and was also found to have pulmonary embolism for which she was restarted on Eliquis (apparently she had been noncompliant with her anticoagulation).  She was discharged to alcohol rehab center on 08/18/2021 but because she was ambulating with a walker, she could not get into the center and she was driven to a homeless shelter; it did not have any beds and patient was sent to emergency room.  On presentation, she was hypotensive with systolic blood pressure in the 70s to 80s and patient complained of ongoing chest pain   Clinical Impression  Patient received in bed, requires encouragement to participate in therapy. Eventually agrees to walk some in room. She is independent with bed mobility, independent with transfers. Supervision for ambulating in room without AD. Has rollator in room but does not use for short distances. Patient will continue to benefit from skilled PT while here to improve functional mobility, and strength. She reports falls in her past. Patient is currently needs housing.       Recommendations for follow up therapy are one component of a multi-disciplinary discharge planning process, led by the attending physician.  Recommendations may be updated based on patient status, additional functional criteria and insurance authorization.  Follow Up Recommendations No PT follow up    Assistance Recommended at Discharge PRN  Functional Status Assessment Patient has had a recent decline in their functional status and demonstrates the ability to make  significant improvements in function in a reasonable and predictable amount of time.  Equipment Recommendations  None recommended by PT    Recommendations for Other Services       Precautions / Restrictions Precautions Precautions: None Restrictions Weight Bearing Restrictions: No      Mobility  Bed Mobility Overal bed mobility: Independent                  Transfers Overall transfer level: Independent Equipment used: None                    Ambulation/Gait Ambulation/Gait assistance: Independent Gait Distance (Feet): 20 Feet Assistive device: None Gait Pattern/deviations: Step-through pattern;Decreased stride length Gait velocity: mildly decreased     General Gait Details: patient ambulated in room without ad, did not want to walk further at this time with rollator.  Stairs            Wheelchair Mobility    Modified Rankin (Stroke Patients Only)       Balance Overall balance assessment: Modified Independent;History of Falls                                           Pertinent Vitals/Pain Pain Assessment: Faces Faces Pain Scale: Hurts a little bit Pain Location: LLE Pain Descriptors / Indicators: Discomfort    Home Living Family/patient expects to be discharged to:: Shelter/Homeless                        Prior Function Prior Level of Function : Independent/Modified Independent  Mobility Comments: uses rollator as needed ADLs Comments: independent     Hand Dominance        Extremity/Trunk Assessment   Upper Extremity Assessment Upper Extremity Assessment: Overall WFL for tasks assessed    Lower Extremity Assessment Lower Extremity Assessment: Overall WFL for tasks assessed    Cervical / Trunk Assessment Cervical / Trunk Assessment: Normal  Communication   Communication: No difficulties  Cognition Arousal/Alertness: Awake/alert Behavior During Therapy: WFL for tasks  assessed/performed Overall Cognitive Status: No family/caregiver present to determine baseline cognitive functioning                                 General Comments: tangential, difficult to keep on topic        General Comments      Exercises     Assessment/Plan    PT Assessment Patient needs continued PT services  PT Problem List Decreased mobility       PT Treatment Interventions Gait training    PT Goals (Current goals can be found in the Care Plan section)  Acute Rehab PT Goals Patient Stated Goal: to get some housing PT Goal Formulation: With patient Time For Goal Achievement: 09/15/21 Potential to Achieve Goals: Fair    Frequency     Barriers to discharge   no home or assistance upon discharge    Co-evaluation               AM-PAC PT "6 Clicks" Mobility  Outcome Measure Help needed turning from your back to your side while in a flat bed without using bedrails?: None Help needed moving from lying on your back to sitting on the side of a flat bed without using bedrails?: None Help needed moving to and from a bed to a chair (including a wheelchair)?: None Help needed standing up from a chair using your arms (e.g., wheelchair or bedside chair)?: None Help needed to walk in hospital room?: None Help needed climbing 3-5 steps with a railing? : A Little 6 Click Score: 23    End of Session   Activity Tolerance: Patient tolerated treatment well Patient left: in bed;with call bell/phone within reach Nurse Communication: Mobility status PT Visit Diagnosis: Muscle weakness (generalized) (M62.81)    Time: [id number] PT Time Calculation (min) (ACUTE ONLY): 20 min   Charges:   PT Evaluation $PT Eval Low Complexity: 1 Low          Robert Sunga, PT, GCS 09/01/21,10:43 AM

## 2021-09-02 DIAGNOSIS — I959 Hypotension, unspecified: Secondary | ICD-10-CM | POA: Diagnosis not present

## 2021-09-02 NOTE — Progress Notes (Signed)
PROGRESS NOTE    Adrienne Villarreal  NWG:[id number] DOB: April 23, 1961 DOA: 08/18/2021 PCP: Pcp, No   Brief Narrative: 60 year old with past medical history significant for alcohol abuse, alcoholic cirrhosis, chronic systolic heart failure, A. fib, PE on Eliquis, homelessness recent admission from 08/10/2021 until 08/18/2021 at [facility] for cirrhosis with ascites requiring paracentesis and diuresis was also found to have PE for which she was restarted on Eliquis.  She was discharged to alcohol rehab center 08/18/2021, because she was ambulating with a walker, she could not get into the center and she was driven to a homeless shelter.  The shelter did not had any bed and patient was directed to the ED.    On presentation she was hypotensive with systolic blood pressure in the 70s and 80s.  She was complaining of ongoing chest pain.  She received IV fluids.  She was found to have a creatinine of 2.0.  Prior creatinine 1.2. She was initially evaluated by psych who IVC her and recommended inpatient psych facility.  Subsequently she improved and now has been cleared by psych.  Patient does not need inpatient psychiatric facility admission.  Okay to DC sitter per psych.  Awaiting drug rehab.    Assessment & Plan:   Principal Problem:   Hypotension Active Problems:   AKI (acute kidney injury) (HCC)   Atrial fibrillation, chronic (HCC)   Alcoholic cirrhosis (HCC)   Prolonged QT interval   Chronic anticoagulation   Noncompliance   Pulmonary embolism (HCC)   Chronic systolic CHF (congestive heart failure) (HCC)   MDD (major depressive disorder), recurrent episode, severe (HCC)   PTSD (post-traumatic stress disorder)   Atypical chest pain   Malnutrition of moderate degree  1-AKI; Patient presented with a creatinine of 2.3.  Renal function improved with fluids.  Initially her diuretics were on hold. Creatinine has remained stable on diuretics. Stable.   2-Hypotension: Likely due to  underlying cirrhosis Tolerating low-dose metoprolol Continue with diuretics Stable, advised patient to take med.s   3-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 -Recently underwent paracentesis at [facility]. -Continue with diuretic -Monitor for need of paracentesis  4-intermittent chest pain: Cardiology evaluated patient 08/21/2021.  Cardiology doubts that patient chest pain is ischemic in nature.  No further intervention. She is on morphine as an outpatient.  Chronic systolic heart failure: Monitor on diuretics  A. 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  She has an IVC filter.  She was not compliant with anticoagulation and was found to have PE few days prior to this [facility].  Continue with Eliquis  Suicidal ideation, MDD, PTSD: See report for suicidal ideation 08/20/2021. Evaluated by psych initially recommended inpatient psychiatric facility. Related by psych 08/2017 has been clear.  Patient does not need inpatient psychiatric facility does not need a sitter  Homelessness: Plan to get PT OT social worker consulted  Stable for discharge. Per Psych she can go out patient drug  rehab , but he would prefer inpatient.    Nutrition Problem: Moderate Malnutrition Etiology: chronic illness (EtOH abuse, cirrhosis, CHF)    Signs/Symptoms: mild fat depletion, moderate fat depletion, mild muscle depletion, moderate muscle depletion, severe muscle depletion    Interventions: Ensure Enlive (each supplement provides 350kcal and 20 grams of protein), MVI, Liberalize Diet  Estimated body mass index is 22.81 kg/m as calculated from the following:   Height as of this encounter: 5\' 3"  (1.6 m).   Weight as  of this encounter: 58.4 kg.   DVT prophylaxis: Eliquis Code Status: Full code Family Communication: care discussed with patient Disposition Plan:  Status is: Inpatient  Remains inpatient appropriate  because: Plan to get PT ot, needs disposition. Clear by psych , stable for discharge, awaiting disposition         Consultants:  Psych  Procedures:    Antimicrobials:    Subjective: Alert, willing to take meds.   Objective: Vitals:   09/01/21 2125 09/02/21 0440 09/02/21 0456 09/02/21 0948  BP: 94/70 104/65  106/78  Pulse: 75 68  70  Resp: 18 18  16   Temp: 98.7 F (37.1 C) 98.4 F (36.9 C)  97.7 F (36.5 C)  TempSrc: Oral Oral  Oral  SpO2: 96% 95%  97%  Weight: 58.4 kg  58.4 kg   Height:        Intake/Output Summary (Last 24 hours) at 09/02/2021 1430 Last data filed at 09/02/2021 0544 Gross per 24 hour  Intake 250 ml  Output 200 ml  Net 50 ml    Filed Weights   08/31/21 2108 09/01/21 2125 09/02/21 0456  Weight: 58.9 kg 58.4 kg 58.4 kg    Examination:  General exam: NAD Respiratory system: CTA Cardiovascular system: S 1, S 2 RRR Gastrointestinal system: BS present, soft, nt Central nervous system: Alert Extremities: No edema   Data Reviewed: I have personally reviewed following labs and imaging studies  CBC: Recent Labs  Lab 08/28/21 0436  WBC 3.6*  NEUTROABS 2.0  HGB 10.7*  HCT 34.4*  MCV 94.5  PLT 303    Basic Metabolic Panel: Recent Labs  Lab 08/28/21 0436 08/30/21 1106  NA 139 135  K 4.3 4.0  CL 109 109  CO2 20* 18*  GLUCOSE 84 114*  BUN 15 18  CREATININE 0.96 1.12*  CALCIUM 10.2 10.4*  MG 1.8  --     GFR: Estimated Creatinine Clearance: 44.7 mL/min (A) (by C-G formula based on SCr of 1.12 mg/dL (H)). Liver Function Tests: Recent Labs  Lab 08/28/21 0436  AST 31  ALT 18  ALKPHOS 135*  BILITOT 0.7  PROT 6.5  ALBUMIN 3.2*    No results for input(s): LIPASE, AMYLASE in the last 168 hours. No results for input(s): AMMONIA in the last 168 hours. Coagulation Profile: No results for input(s): INR, PROTIME in the last 168 hours. Cardiac Enzymes: No results for input(s): CKTOTAL, CKMB, CKMBINDEX, TROPONINI in the last  168 hours. BNP (last 3 results) No results for input(s): PROBNP in the last 8760 hours. HbA1C: No results for input(s): HGBA1C in the last 72 hours. CBG: No results for input(s): GLUCAP in the last 168 hours. Lipid Profile: No results for input(s): CHOL, HDL, LDLCALC, TRIG, CHOLHDL, LDLDIRECT in the last 72 hours. Thyroid Function Tests: No results for input(s): TSH, T4TOTAL, FREET4, T3FREE, THYROIDAB in the last 72 hours. Anemia Panel: No results for input(s): VITAMINB12, FOLATE, FERRITIN, TIBC, IRON, RETICCTPCT in the last 72 hours. Sepsis Labs: No results for input(s): PROCALCITON, LATICACIDVEN in the last 168 hours.  No results found for this or any previous visit (from the past 240 hour(s)).       Radiology Studies: No results found.      Scheduled Meds:  apixaban  5 mg Oral BID   ARIPiprazole  2 mg Oral Daily   feeding supplement  237 mL Oral BID BM   ferrous sulfate  325 mg Oral Q48H   folic acid  1 mg Oral Daily  lactulose  10 g Oral BID   latanoprost  1 drop Both Eyes QHS   melatonin  5 mg Oral QHS   metoprolol tartrate  12.5 mg Oral BID   multivitamin with minerals  1 tablet Oral Daily   pantoprazole  20 mg Oral Daily   promethazine  25 mg Oral Once   spironolactone  25 mg Oral Daily   sucralfate  1 g Oral TID WC & HS   thiamine  100 mg Oral Daily   torsemide  10 mg Oral Daily   Continuous Infusions:   LOS: 13 days    Time spent: 35 minutes    Adrienne Medel A Kyleah Pensabene, MD [facility]   If 7PM-7AM, please contact night-coverage [email or web address]  09/02/2021, 2:30 PM

## 2021-09-02 NOTE — Plan of Care (Signed)

## 2021-09-03 DIAGNOSIS — I959 Hypotension, unspecified: Secondary | ICD-10-CM | POA: Diagnosis not present

## 2021-09-03 LAB — HEMOGLOBIN A1C
Hgb A1c MFr Bld: 5.7 % — ABNORMAL HIGH (ref 4.8–5.6)
Mean Plasma Glucose: 116.89 mg/dL

## 2021-09-03 NOTE — Progress Notes (Signed)
PROGRESS NOTE    Adrienne Villarreal  [id number] DOB: [birth year] DOA: 08/18/2021 PCP: Pcp, No   Brief Narrative: 61 year old with past medical history significant for alcohol abuse, alcoholic cirrhosis, chronic systolic heart failure, A. fib, PE on Eliquis, homelessness recent admission from 08/10/2021 until 08/18/2021 at [facility] for cirrhosis with ascites requiring paracentesis and diuresis was also found to have PE for which she was restarted on Eliquis.  She was discharged to alcohol rehab center 08/18/2021, because she was ambulating with a walker, she could not get into the center and she was driven to a homeless shelter.  The shelter did not had any bed and patient was directed to the ED.    On presentation she was hypotensive with systolic blood pressure in the 70s and 80s.  She was complaining of ongoing chest pain.  She received IV fluids.  She was found to have a creatinine of 2.0.  Prior creatinine 1.2. She was initially evaluated by psych who IVC her and recommended inpatient psych facility.  Subsequently she improved and now has been cleared by psych.  Patient does not need inpatient psychiatric facility admission.  Okay to DC sitter per psych.  Awaiting drug rehab.    Assessment & Plan:   Principal Problem:   Hypotension Active Problems:   AKI (acute kidney injury) ([place])   Atrial fibrillation, chronic (HCC)   Alcoholic cirrhosis (HCC)   Prolonged QT interval   Chronic anticoagulation   Noncompliance   Pulmonary embolism (HCC)   Chronic systolic CHF (congestive heart failure) (HCC)   MDD (major depressive disorder), recurrent episode, severe (HCC)   PTSD (post-traumatic stress disorder)   Atypical chest pain   Malnutrition of moderate degree  1-AKI; Patient presented with a creatinine of 2.3.  Renal function improved with fluids.  Initially her diuretics were on hold. Stable.  Refuse diuretics at times.   2-Hypotension: Likely due to underlying  cirrhosis Tolerating low-dose metoprolol, but hold metoprolol today due to BP in the 90 Refuse diuretics  Stable, advised patient to take meds.   3-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 -Recently underwent paracentesis at [facility] for need of paracentesis -on spironolactone.   4-Intermittent chest pain: Cardiology evaluated patient 08/21/2021.  Cardiology doubts that patient chest pain is ischemic in nature.  No further intervention. She is on morphine as an outpatient.  Chronic systolic heart failure: Monitor on diuretics  A. 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  She has an IVC filter.  She was not compliant with anticoagulation and was found to have PE few days prior to this hospitalization at [facility].  Continue with Eliquis  Suicidal ideation, MDD, PTSD: See report for suicidal ideation 08/20/2021. Evaluated by psych initially recommended inpatient psychiatric facility. Related by psych 08/2017 has been clear.  Patient does not need inpatient psychiatric facility does not need a sitter  Homelessness: Plan to get PT OT social worker consulted  Stable for discharge. Per Psych she can go out patient drug  rehab , but he would prefer inpatient.    Nutrition Problem: Moderate Malnutrition Etiology: chronic illness (EtOH abuse, cirrhosis, CHF)    Signs/Symptoms: mild fat depletion, moderate fat depletion, mild muscle depletion, moderate muscle depletion, severe muscle depletion    Interventions: Ensure Enlive (each supplement provides 350kcal and 20 grams of protein), MVI, Liberalize Diet  Estimated body mass index is 22.81 kg/m as calculated from the following:   Height as of this  encounter: 5\' 3"  (1.6 m).   Weight as of this encounter: 58.4 kg.   DVT prophylaxis: Eliquis Code Status: Full code Family Communication: care discussed with patient Disposition Plan:  Status is:  Inpatient  Remains inpatient appropriate because: Plan to get PT ot, needs disposition. Clear by psych , stable for discharge, awaiting disposition         Consultants:  Psych  Procedures:    Antimicrobials:    Subjective: She is alert, denies pain. Denies dyspnea.   Objective: Vitals:   09/02/21 2053 09/03/21 0422 09/03/21 0943 09/03/21 1142  BP: 118/67 99/63 97/67  106/68  Pulse: 84 65 66 72  Resp: 18 18 16 19   Temp: 98 F (36.7 C) 97.7 F (36.5 C) (!) 97.4 F (36.3 C) 98 F (36.7 C)  TempSrc: Oral Oral Oral Oral  SpO2: 97% 95% 96%   Weight:      Height:        Intake/Output Summary (Last 24 hours) at 09/03/2021 1255 Last data filed at 09/03/2021 0800 Gross per 24 hour  Intake 960 ml  Output --  Net 960 ml    Filed Weights   08/31/21 2108 09/01/21 2125 09/02/21 0456  Weight: 58.9 kg 58.4 kg 58.4 kg    Examination:  General exam: NAD Respiratory system: CTA Cardiovascular system: S 1, S 2 RRR Gastrointestinal system: BS present, soft, nt Central nervous system: Alert Extremities: No edema   Data Reviewed: I have personally reviewed following labs and imaging studies  CBC: Recent Labs  Lab 08/28/21 0436  WBC 3.6*  NEUTROABS 2.0  HGB 10.7*  HCT 34.4*  MCV 94.5  PLT [id number]    Basic Metabolic Panel: Recent Labs  Lab 08/28/21 0436 08/30/21 1106  NA 139 135  K 4.3 4.0  CL 109 109  CO2 20* 18*  GLUCOSE 84 114*  BUN 15 18  CREATININE 0.96 1.12*  CALCIUM 10.2 10.4*  MG 1.8  --     GFR: Estimated Creatinine Clearance: 44.7 mL/min (A) (by C-G formula based on SCr of 1.12 mg/dL (H)). Liver Function Tests: Recent Labs  Lab 08/28/21 0436  AST 31  ALT 18  ALKPHOS 135*  BILITOT 0.7  PROT 6.5  ALBUMIN 3.2*    No results for input(s): LIPASE, AMYLASE in the last 168 hours. No results for input(s): AMMONIA in the last 168 hours. Coagulation Profile: No results for input(s): INR, PROTIME in the last 168 hours. Cardiac Enzymes: No  results for input(s): CKTOTAL, CKMB, CKMBINDEX, TROPONINI in the last 168 hours. BNP (last 3 results) No results for input(s): PROBNP in the last 8760 hours. HbA1C: No results for input(s): HGBA1C in the last 72 hours. CBG: No results for input(s): GLUCAP in the last 168 hours. Lipid Profile: No results for input(s): CHOL, HDL, LDLCALC, TRIG, CHOLHDL, LDLDIRECT in the last 72 hours. Thyroid Function Tests: No results for input(s): TSH, T4TOTAL, FREET4, T3FREE, THYROIDAB in the last 72 hours. Anemia Panel: No results for input(s): VITAMINB12, FOLATE, FERRITIN, TIBC, IRON, RETICCTPCT in the last 72 hours. Sepsis Labs: No results for input(s): PROCALCITON, LATICACIDVEN in the last 168 hours.  No results found for this or any previous visit (from the past 240 hour(s)).       Radiology Studies: No results found.      Scheduled Meds:  apixaban  5 mg Oral BID   ARIPiprazole  2 mg Oral Daily   feeding supplement  237 mL Oral BID BM   ferrous sulfate  325 mg  Oral [id number]   folic acid  1 mg Oral Daily   lactulose  10 g Oral BID   latanoprost  1 drop Both Eyes QHS   melatonin  5 mg Oral QHS   metoprolol tartrate  12.5 mg Oral BID   multivitamin with minerals  1 tablet Oral Daily   pantoprazole  20 mg Oral Daily   promethazine  25 mg Oral Once   spironolactone  25 mg Oral Daily   sucralfate  1 g Oral TID WC & HS   thiamine  100 mg Oral Daily   torsemide  10 mg Oral Daily   Continuous Infusions:   LOS: 14 days    Time spent: 35 minutes    Averyanna Sax A Deshundra Waller, MD [facility]   If 7PM-7AM, please contact night-coverage [email or web address]  09/03/2021, 12:55 PM

## 2021-09-04 DIAGNOSIS — I959 Hypotension, unspecified: Secondary | ICD-10-CM | POA: Diagnosis not present

## 2021-09-04 NOTE — Evaluation (Signed)
Occupational Therapy Evaluation Patient Details Name: Adrienne Villarreal MRN: [id number] DOB: Sep 01, 1961 Today's Date: 09/04/2021   History of Present Illness 61 year old female with history of alcohol abuse, alcoholic cirrhosis, chronic systolic CHF, atrial fibrillation and PEs on Eliquis, anxiety, homelessness, recent admission from 08/10/2021-08/18/2021 at [facility] for cirrhosis with ascites requiring paracentesis and diuresis and was also found to have pulmonary embolism for which she was restarted on Eliquis (apparently she had been noncompliant with her anticoagulation).  She was discharged to alcohol rehab center on 08/18/2021 but because she was ambulating with a walker, she could not get into the center and she was driven to a homeless shelter; it did not have any beds and patient was sent to emergency room.  On presentation, she was hypotensive with systolic blood pressure in the 70s to 80s and patient complained of ongoing chest pain   Clinical Impression   Pt initially upset about having to walk however was able to easily redirect pt to discuss any barriers to completing her self care. Pt able to complete basic ADL tasks independently, however has low vision and therefore difficulty using her phone and seeing print.  Settings adjusted on pt's phone and pt issued readers and magnifiers with/without light which significantly improved her functional vision. Pt laughing and joking at end of session. Will follow up with pt to go over compensatory strategies for low vision. Pt very appreciative. No OT follow up needed after DC.      Recommendations for follow up therapy are one component of a multi-disciplinary discharge planning process, led by the attending physician.  Recommendations may be updated based on patient status, additional functional criteria and insurance authorization.   Follow Up Recommendations  Other (comment) (awaiting drug rehab)    Assistance Recommended at  Discharge Intermittent Supervision/Assistance  Functional Status Assessment  Patient has not had a recent decline in their functional status  Equipment Recommendations  None recommended by OT    Recommendations for Other Services       Precautions / Restrictions Precautions Precautions: None      Mobility Bed Mobility Overal bed mobility: Independent                  Transfers Overall transfer level: Independent Equipment used: None                      Balance Overall balance assessment: Modified Independent;History of Falls                                         ADL either performed or assessed with clinical judgement   ADL                                               Vision Ability to See in Adequate Light: 1 Impaired Additional Comments: 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"; R eye impaired however can see large letters     Perception     Praxis      Pertinent Vitals/Pain Pain Assessment: Faces Faces Pain Scale: Hurts a little bit Pain Location: R ribs/chest at times Pain Descriptors / Indicators: Discomfort;Shooting;Stabbing Pain Intervention(s): Limited activity within patient's tolerance     Hand Dominance     Extremity/Trunk Assessment Upper Extremity  Assessment Upper Extremity Assessment: Overall WFL for tasks assessed   Lower Extremity Assessment Lower Extremity Assessment: Defer to PT evaluation   Cervical / Trunk Assessment Cervical / Trunk Assessment: Normal   Communication Communication Communication: No difficulties   Cognition Arousal/Alertness: Awake/alert Behavior During Therapy: WFL for tasks assessed/performed Overall Cognitive Status: Within Functional Limits for tasks assessed                                       General Comments  Uses rollator for longer distances    Exercises     Shoulder Instructions      Home Living Family/patient expects  to be discharged to:: Shelter/Homeless                                        Prior Functioning/Environment Prior Level of Function : Independent/Modified Independent             Mobility Comments: uses rollator as needed ADLs Comments: independent        OT Problem List: Impaired vision/perception      OT Treatment/Interventions: Therapeutic activities;DME and/or AE instruction;Therapeutic exercise;Visual/perceptual remediation/compensation;Patient/family education    OT Goals(Current goals can be found in the care plan section) Acute Rehab OT Goals Patient Stated Goal: to see better OT Goal Formulation: With patient Time For Goal Achievement: 09/18/21 Potential to Achieve Goals: Good  OT Frequency: Min 2X/week   Barriers to D/C:            Co-evaluation              AM-PAC OT "6 Clicks" Daily Activity     Outcome Measure Help from another person eating meals?: None Help from another person taking care of personal grooming?: None Help from another person toileting, which includes using toliet, bedpan, or urinal?: None Help from another person bathing (including washing, rinsing, drying)?: None Help from another person to put on and taking off regular upper body clothing?: None Help from another person to put on and taking off regular lower body clothing?: None 6 Click Score: 24   End of Session Nurse Communication: Other (comment) (vision aids)  Activity Tolerance: Patient tolerated treatment well Patient left: in bed;with call bell/phone within reach  OT Visit Diagnosis: Low vision, both eyes (H54.2)                Time: 1405-1430 OT Time Calculation (min): 25 min Charges:  OT General Charges $OT Visit: 1 Visit OT Evaluation $OT Eval Low Complexity: 1 Low OT Treatments $Therapeutic Activity: 8-22 mins  Luisa Dago, OT/L   Acute OT Clinical Specialist Acute Rehabilitation Services Pager [phone] Office [phone]    [facility] 09/04/2021, 2:45 PM

## 2021-09-04 NOTE — Progress Notes (Signed)
PROGRESS NOTE    Adrienne Villarreal  [id number] DOB: Feb 26, 1961 DOA: 08/18/2021 PCP: Pcp, No   Brief Narrative: 61 year old with past medical history significant for alcohol abuse, alcoholic cirrhosis, chronic systolic heart failure, A. fib, PE on Eliquis, homelessness recent admission from 08/10/2021 until 08/18/2021 at [facility] for cirrhosis with ascites requiring paracentesis and diuresis was also found to have PE for which she was restarted on Eliquis.  She was discharged to alcohol rehab center 08/18/2021, because she was ambulating with a walker, she could not get into the center and she was driven to a homeless shelter.  The shelter did not had any bed and patient was directed to the ED.    On presentation she was hypotensive with systolic blood pressure in the 70s and 80s.  She was complaining of ongoing chest pain.  She received IV fluids.  She was found to have a creatinine of 2.0.  Prior creatinine 1.2. She was initially evaluated by psych who IVC her and recommended inpatient psych facility.  Subsequently she improved and now has been cleared by psych.  Patient does not need inpatient psychiatric facility admission.  Okay to DC sitter per psych.  Awaiting drug rehab.    Assessment & Plan:   Principal Problem:   Hypotension Active Problems:   AKI (acute kidney injury) ([place])   Atrial fibrillation, chronic (HCC)   Alcoholic cirrhosis (HCC)   Prolonged QT interval   Chronic anticoagulation   Noncompliance   Pulmonary embolism (HCC)   Chronic systolic CHF (congestive heart failure) (HCC)   MDD (major depressive disorder), recurrent episode, severe (HCC)   PTSD (post-traumatic stress disorder)   Atypical chest pain   Malnutrition of moderate degree  1-AKI; Patient presented with a creatinine of 2.3.  Renal function improved with fluids.  Initially her diuretics were on hold. Stable.  Refuse diuretics at times.   2-Hypotension: Likely due to underlying  cirrhosis Tolerating low-dose metoprolol, but hold metoprolol today due to BP in the 90 Refuse diuretics  Stable, advised patient to take meds.   3-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 -Recently underwent paracentesis at [facility] for need of paracentesis -on spironolactone.   4-Intermittent chest pain: Cardiology evaluated patient 08/21/2021.  Cardiology doubts that patient chest pain is ischemic in nature.  No further intervention. She is on morphine as an outpatient.  Chronic systolic heart failure: Monitor on diuretics  A. 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  She has an IVC filter.  She was not compliant with anticoagulation and was found to have PE few days prior to this [facility].  Continue with Eliquis.  Suicidal ideation, MDD, PTSD: See report for suicidal ideation 08/20/2021. Evaluated by psych initially recommended inpatient psychiatric facility. Related by psych 08/2017 has been clear.  Patient does not need inpatient psychiatric facility does not need a sitter.  Homelessness: Plan to get PT OT social worker consulted  Stable for discharge. Per Psych she can go out patient drug  rehab , but he would prefer inpatient.    Nutrition Problem: Moderate Malnutrition Etiology: chronic illness (EtOH abuse, cirrhosis, CHF)    Signs/Symptoms: mild fat depletion, moderate fat depletion, mild muscle depletion, moderate muscle depletion, severe muscle depletion    Interventions: Ensure Enlive (each supplement provides 350kcal and 20 grams of protein), MVI, Liberalize Diet  Estimated body mass index is 22.81 kg/m as calculated from the following:   Height as of this  encounter: 5\' 3"  (1.6 m).   Weight as of this encounter: 58.4 kg.   DVT prophylaxis: Eliquis Code Status: Full code Family Communication: care discussed with patient Disposition Plan:  Status is:  Inpatient  Remains inpatient appropriate because: Plan to get PT ot, needs disposition. Clear by psych , stable for discharge, awaiting disposition         Consultants:  Psych  Procedures:    Antimicrobials:    Subjective: No new complaints.   Objective: Vitals:   09/03/21 1707 09/03/21 2140 09/04/21 0622 09/04/21 0920  BP: 120/82 98/70 110/67 109/79  Pulse: (!) 102 63 (!) 58 82  Resp: 18 18 18 18   Temp: 98.4 F (36.9 C) 97.6 F (36.4 C) (!) 97.4 F (36.3 C) 98.4 F (36.9 C)  TempSrc: Oral  Oral Oral  SpO2: 98% 93% 97% 99%  Weight:      Height:        Intake/Output Summary (Last 24 hours) at 09/04/2021 1415 Last data filed at 09/04/2021 1000 Gross per 24 hour  Intake 717 ml  Output --  Net 717 ml    Filed Weights   08/31/21 2108 09/01/21 2125 09/02/21 0456  Weight: 58.9 kg 58.4 kg 58.4 kg    Examination:  General exam: NAD Respiratory system:  CTA Cardiovascular system:  S 1, S 2 RRR Gastrointestinal system: BS present, soft, nt Central nervous system: Alert Extremities: no edema   Data Reviewed: I have personally reviewed following labs and imaging studies  CBC: No results for input(s): WBC, NEUTROABS, HGB, HCT, MCV, PLT in the last 168 hours.  Basic Metabolic Panel: Recent Labs  Lab 08/30/21 1106  NA 135  K 4.0  CL 109  CO2 18*  GLUCOSE 114*  BUN 18  CREATININE 1.12*  CALCIUM 10.4*    GFR: Estimated Creatinine Clearance: 44.7 mL/min (A) (by C-G formula based on SCr of 1.12 mg/dL (H)). Liver Function Tests: No results for input(s): AST, ALT, ALKPHOS, BILITOT, PROT, ALBUMIN in the last 168 hours.  No results for input(s): LIPASE, AMYLASE in the last 168 hours. No results for input(s): AMMONIA in the last 168 hours. Coagulation Profile: No results for input(s): INR, PROTIME in the last 168 hours. Cardiac Enzymes: No results for input(s): CKTOTAL, CKMB, CKMBINDEX, TROPONINI in the last 168 hours. BNP (last 3 results) No results  for input(s): PROBNP in the last 8760 hours. HbA1C: Recent Labs    09/03/21 1410  HGBA1C 5.7*   CBG: No results for input(s): GLUCAP in the last 168 hours. Lipid Profile: No results for input(s): CHOL, HDL, LDLCALC, TRIG, CHOLHDL, LDLDIRECT in the last 72 hours. Thyroid Function Tests: No results for input(s): TSH, T4TOTAL, FREET4, T3FREE, THYROIDAB in the last 72 hours. Anemia Panel: No results for input(s): VITAMINB12, FOLATE, FERRITIN, TIBC, IRON, RETICCTPCT in the last 72 hours. Sepsis Labs: No results for input(s): PROCALCITON, LATICACIDVEN in the last 168 hours.  No results found for this or any previous visit (from the past 240 hour(s)).       Radiology Studies: No results found.      Scheduled Meds:  apixaban  5 mg Oral BID   ARIPiprazole  2 mg Oral Daily   feeding supplement  237 mL Oral BID BM   ferrous sulfate  325 mg Oral [id number]   folic acid  1 mg Oral Daily   lactulose  10 g Oral BID   latanoprost  1 drop Both Eyes QHS   melatonin  5 mg Oral QHS  metoprolol tartrate  12.5 mg Oral BID   multivitamin with minerals  1 tablet Oral Daily   pantoprazole  20 mg Oral Daily   promethazine  25 mg Oral Once   spironolactone  25 mg Oral Daily   sucralfate  1 g Oral TID WC & HS   thiamine  100 mg Oral Daily   torsemide  10 mg Oral Daily   Continuous Infusions:   LOS: 15 days    Time spent: 35 minutes    Florean Hoobler A Berenise Hunton, MD [facility]   If 7PM-7AM, please contact night-coverage [email or web address]  09/04/2021, 2:15 PM

## 2021-09-04 NOTE — Plan of Care (Signed)
°  Problem: Clinical Measurements: °Goal: Cardiovascular complication will be avoided °Outcome: Progressing °  °Problem: Activity: °Goal: Risk for activity intolerance will decrease °Outcome: Progressing °  °Problem: Coping: °Goal: Level of anxiety will decrease °Outcome: Progressing °  °

## 2021-09-04 NOTE — TOC Progression Note (Signed)
Transition of Care [facility]) - Progression Note    Patient Details  Name: Kaniesha Barile MRN: [id number] Date of Birth: 1960-11-16  Transition of Care [facility]) CM/SW Contact  Tom-Johnson, Hershal Coria, RN Phone Number: 09/04/2021, 4:09 PM  Clinical Narrative:     CM followup with [facility] [id number]) and spoke with Inetta Fermo. Inetta Fermo states that patient does not meet criteria to be admitted to their facility. CM asked what was the criteria was and she states that patient uses a walker and they do not accept patient with a walker. CM had PT re eval patient as patient has been ambulating without a walker.Patient has personal rollator as needed. CM faxed PT's updated notes to [facility] and Claude call to say patient was denied again for medical history of Heart failure, Liver Cirrhosis and A-fib and facility cannot medically care for patient. CM spoke again with patient and she is still declining shelters, stating she will be left outside in the cold during the day. CM reached out to Carley Hammed, [place] and she called [facility] [place] and states they will need to do an intake assessment, maintain a job, and have 2 forms of ID. They may also work with SSI, but if she isn't willing to work, it is not a good fit. Patient had stated she is disabled and cannot work. Due to today considered as a holiday and time, CM will followup tomorrow with the expertise of CSW.        Expected Discharge Plan and Services           Expected Discharge Date: 09/01/21                                     Social Determinants of Health (SDOH) Interventions    Readmission Risk Interventions No flowsheet data found.

## 2021-09-04 NOTE — Progress Notes (Addendum)
PT Cancellation Note  Patient Details Name: Netty Sullivant MRN: [id number] DOB: [birth year]   Cancelled Treatment:    Reason Eval/Treat Not Completed: PT screened, no needs identified, will sign off Pt demonstrated ability to ambulate room distances ~25 ft with no assistive device independently. She declined ambulation in hallway due to fatigue. No further acute PT needs.  Lillia Pauls, PT, DPT Acute Rehabilitation Services Pager [phone] Office [phone]    Norval Morton 09/04/2021, 1:33 PM

## 2021-09-05 DIAGNOSIS — I2609 Other pulmonary embolism with acute cor pulmonale: Secondary | ICD-10-CM

## 2021-09-05 DIAGNOSIS — K7031 Alcoholic cirrhosis of liver with ascites: Secondary | ICD-10-CM | POA: Diagnosis not present

## 2021-09-05 DIAGNOSIS — I952 Hypotension due to drugs: Secondary | ICD-10-CM | POA: Diagnosis not present

## 2021-09-05 DIAGNOSIS — N179 Acute kidney failure, unspecified: Secondary | ICD-10-CM | POA: Diagnosis not present

## 2021-09-05 DIAGNOSIS — I482 Chronic atrial fibrillation, unspecified: Secondary | ICD-10-CM | POA: Diagnosis not present

## 2021-09-05 NOTE — TOC Progression Note (Signed)
Transition of Care [facility]) - Initial/Assessment Note    Patient Details  Name: Adrienne Villarreal MRN: [id number] Date of Birth: 03-Dec-1960  Transition of Care [facility]) CM/SW Contact:    Ralene Bathe, LCSWA Phone Number: 09/05/2021, 3:20 PM  Clinical Narrative:                 CSW was notified by DTP CSW, Okey Regal, that the patient would be transferred to the DTP team and report was given.   DTP CSW, Okey Regal, presented the idea to transition the patient to a shelter, [facility] before 2pm, due to the patient having income and being medically ready.   Current CSW presented this discharge plan to current attending, Regalado.  Dr. Sunnie Nielsen consulted with DTP NP who will assume the patient tomorrow and it was determined that the plan would not be the d/c the patient to the [facility] today.  DTP CSW notified.  TOC leadership also notified.        Patient Goals and CMS Choice        Expected Discharge Plan and Services           Expected Discharge Date: 09/01/21                                    Prior Living Arrangements/Services                       Activities of Daily Living Home Assistive Devices/Equipment: Other (Comment) (walker) ADL Screening (condition at time of admission) Patient's cognitive ability adequate to safely complete daily activities?: No Is the patient deaf or have difficulty hearing?: No Does the patient have difficulty seeing, even when wearing glasses/contacts?: No Does the patient have difficulty concentrating, remembering, or making decisions?: No Patient able to express need for assistance with ADLs?: No Does the patient have difficulty dressing or bathing?: No Independently performs ADLs?: Yes (appropriate for developmental age) Does the patient have difficulty walking or climbing stairs?: Yes Weakness of Legs: None Weakness of Arms/Hands: None  Permission Sought/Granted                  Emotional Assessment              Admission  diagnosis:  AKI (acute kidney injury) (HCC) [N17.9] Hypotension, unspecified hypotension type [I95.9] Patient Active Problem List   Diagnosis Date Noted   Malnutrition of moderate degree 08/23/2021   Atypical chest pain    MDD (major depressive disorder), recurrent episode, severe (HCC) 08/21/2021   PTSD (post-traumatic stress disorder) 08/21/2021   AKI (acute kidney injury) (HCC) 08/19/2021   Atrial fibrillation, chronic (HCC) 08/19/2021   Hypotension 08/19/2021   Alcoholic cirrhosis (HCC) 08/19/2021   Prolonged QT interval 08/19/2021   Chronic anticoagulation 08/19/2021   Noncompliance 08/19/2021   Pulmonary embolism (HCC) 08/19/2021   Chronic systolic CHF (congestive heart failure) (HCC) 08/19/2021   PCP:  Pcp, No Pharmacy:   [id number] - [place], [place] - [address] [facility] [address] [place] [address] Phone: [phone] Fax: [phone]  [facility] [address] [place] [address] Phone: [phone] Fax: [phone]     Social Determinants of Health (SDOH) Interventions    Readmission Risk Interventions No flowsheet data found.

## 2021-09-05 NOTE — Progress Notes (Signed)
SATURATION QUALIFICATIONS: (This note is used to comply with regulatory documentation for home oxygen)  Patient Saturations on Room Air at Rest = 95%  Patient Saturations on Room Air while Ambulating = 94%  Patient Saturations on N/A Liters of oxygen while Ambulating = n/a%  Please briefly explain why patient needs home oxygen: Patient had no discomforts or shortness of breath while ambulating in room air.

## 2021-09-05 NOTE — Progress Notes (Signed)
Nutrition Follow-up  DOCUMENTATION CODES:   Non-severe (moderate) malnutrition in context of chronic illness  INTERVENTION:  -Continue Ensure Enlive po BID, each supplement provides 350 kcal and 20 grams of protein - Continue MVI with minerals daily -Continue HS snack  NUTRITION DIAGNOSIS:   Moderate Malnutrition related to chronic illness (EtOH abuse, cirrhosis, CHF) as evidenced by mild fat depletion, moderate fat depletion, mild muscle depletion, moderate muscle depletion, severe muscle depletion.  On-going  GOAL:   Patient will meet greater than or equal to 90% of their needs  Progressing- addressing needs through diet liberalization and nutrition supplements  MONITOR:   PO intake, Supplement acceptance, Weight trends, Labs, I & O's  REASON FOR ASSESSMENT:   Malnutrition Screening Tool    ASSESSMENT:   61 year old female with history of alcohol abuse, alcoholic cirrhosis, chronic systolic CHF, atrial fibrillation and PEs on Eliquis, anxiety, SI, homelessness, recent admission from 08/10/2021-08/18/2021 at [facility] for cirrhosis with ascites requiring paracentesis and diuresis and was also found to have pulmonary embolism for which she was restarted on Eliquis (apparently she had been noncompliant with her anticoagulation).  She was discharged to alcohol rehab center on 08/18/2021. Admitted now with AKI and hypotension  Pt pending discharge but with difficult disposition due to being homeless. Pt reports appetite has been great since last RD assessment. Last 8 meal completions charted as 0-100% (57.5% avg meal intake). Per RN, pt doing well with supplement and pt endorses supplement consumption.   Admit weight: 49.8 kg Current weight: 58.4 kg  Medications:  apixaban  5 mg Oral BID   ARIPiprazole  2 mg Oral Daily   feeding supplement  237 mL Oral BID BM   ferrous sulfate  325 mg Oral [id number]   folic acid  1 mg Oral Daily   lactulose  10 g Oral BID    latanoprost  1 drop Both Eyes QHS   melatonin  5 mg Oral QHS   metoprolol tartrate  12.5 mg Oral BID   multivitamin with minerals  1 tablet Oral Daily   pantoprazole  20 mg Oral Daily   promethazine  25 mg Oral Once   spironolactone  25 mg Oral Daily   sucralfate  1 g Oral TID WC & HS   thiamine  100 mg Oral Daily   torsemide  10 mg Oral Daily   Labs: Recent Labs  Lab 08/30/21 1106  NA 135  K 4.0  CL 109  CO2 18*  BUN 18  CREATININE 1.12*  CALCIUM 10.4*  GLUCOSE 114*    Diet Order:   Diet Order             Diet - low sodium heart healthy           Diet regular Room service appropriate? Yes; Fluid consistency: Thin; Fluid restriction: 1500 mL Fluid  Diet effective now                   EDUCATION NEEDS:   Not appropriate for education at this time  Skin:  Skin Assessment: Reviewed RN Assessment  Last BM:  1/1  Height:   Ht Readings from Last 1 Encounters:  08/18/21 5\' 3"  (1.6 m)    Weight:   Wt Readings from Last 1 Encounters:  09/02/21 58.4 kg    BMI:  Body mass index is 22.81 kg/m.  Estimated Nutritional Needs:   Kcal:  1550-1750  Protein:  75-90 grams  Fluid:  1.5L     Estill Bamberg  A., MS, RD, LDN (she/her/hers) RD pager number and weekend/on-call pager number located in [place].

## 2021-09-05 NOTE — Plan of Care (Signed)
°  Problem: Education: °Goal: Knowledge of General Education information will improve °Description: Including pain rating scale, medication(s)/side effects and non-pharmacologic comfort measures °Outcome: Progressing °  °Problem: Clinical Measurements: °Goal: Ability to maintain clinical measurements within normal limits will improve °Outcome: Progressing °  °Problem: Nutrition: °Goal: Adequate nutrition will be maintained °Outcome: Progressing °  °

## 2021-09-05 NOTE — Progress Notes (Signed)
[facility] PROGRESS NOTE  Adrienne Villarreal [id number] DOB: 1961/03/12 DOA: 08/18/2021 PCP: Pcp, No  Status: Remains inpatient appropriate because:  Unsafe discharge plan-attempting to determine if patient's fatigue and dyspnea on exertion are related to exertional hypoxemia  Barriers to discharge: Social: Patient homeless although does receive a disability check for $914 per month and has access to Medicaid  Clinical: Awaiting ambulatory O2 saturations to determine if patient has exertional hypoxemia  Level of care:  Med-Surg   Code Status: Full Family Communication: Patient only DVT prophylaxis: Eliquis COVID vaccination status: Unknown   HPI: 61 year old with past medical history significant for alcohol abuse, alcoholic cirrhosis, chronic systolic heart failure, A. fib, PE on Eliquis, homelessness recent admission from 08/10/2021 until 08/18/2021 at [facility] for cirrhosis with ascites requiring paracentesis and diuresis was also found to have PE for which she was restarted on Eliquis.  She was discharged to alcohol rehab center 08/18/2021, because she was ambulating with a walker, she could not get into the center and she was driven to a homeless shelter.  The shelter did not had any bed and patient was directed to the ED.     On presentation she was hypotensive with systolic blood pressure in the 70s and 80s.  She was complaining of ongoing chest pain.  She received IV fluids.  She was found to have a creatinine of 2.0.  Prior creatinine 1.2. She was initially evaluated by psych who IVC her and recommended inpatient psych facility.  Subsequently she improved and now has been cleared by psych.  Patient does not need inpatient psychiatric facility admission.  Subjective: Alert and very talkative.  Verbalizes concerns over discharging to a homeless shelter related to her chronic physical debilities and her own personal safety in a homeless shelter setting.   Patient reports that she does not make enough money on her disability check to stay at a long stay hotel or make rent in an apartment and pay her other expenses.  Objective: Vitals:   09/05/21 0625 09/05/21 0957  BP: 99/82 107/68  Pulse: 77 63  Resp: 14 18  Temp: 98.2 F (36.8 C) 98 F (36.7 C)  SpO2: 96%     Intake/Output Summary (Last 24 hours) at 09/05/2021 1301 Last data filed at 09/05/2021 1000 Gross per 24 hour  Intake 597 ml  Output 0 ml  Net 597 ml   Filed Weights   08/31/21 2108 09/01/21 2125 09/02/21 0456  Weight: 58.9 kg 58.4 kg 58.4 kg    Exam:  Constitutional: NAD, calm, comfortable Respiratory: clear to auscultation bilaterally, no wheezing, no crackles. Normal respiratory effort. No accessory muscle use. RA Cardiovascular: Regular rate and rhythm, no murmurs / rubs / gallops. No extremity edema. 2+ pedal pulses.  Abdomen: no tenderness, no masses palpated.  Positive nontender hepatic enlargement.  Soft nontender abdominal ascites -bowel sounds positive. LBM Neurologic: CN 2-12 grossly intact. Sensation intact, DTR normal. Strength 4/5 x all 4 extremities.  Psychiatric: Normal judgment and insight. Alert and oriented x 3. Normal mood.    Assessment/Plan: Acute problems: AKI Patient presented with a creatinine of 2.3 with resolution after discontinuation of diuretics and volume replacement Current creatinine between 0.96 and 1.12 and remained stable after resumption of Aldactone and Demadex   Hypotension:  Suboptimal BP readings in the 90-100 range likely related to hypoalbuminemic state from cirrhosis Patient typically on metoprolol for rate control and atrial fibrillation but she has been refusing this medication Dose metoprolol 12.5 twice daily ordered-consider changing  to once daily dosing at bedtime with a short acting formulation as long as heart rate control   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 Recently underwent paracentesis  at [facility] but stated had significant discomfort during procedure and prefers to use diuretics to treat ascites Current ascites soft and not impinging on respiratory effort and are nontender Meld score is 12 with an estimated 66-month mortality of 6% Continue spironolactone and Demadex Continue Chronulac to prevent encephalopathy Mild leukopenia secondary to chronic cirrhosis; platelets are normal as well as recent INR at outside hospital  Physical deconditioning Patient has significantly improved in regards to her ability to mobilize without walker although she continues to have excessive fatigue and shortness of breath as she ambulates (PT documents ambulates about 25 feet without assistive device before onset of fatigue) Will obtain ambulatory sats to determine if patient will need short-term oxygen especially with known history of heart failure, ascites and recurrent PE Does not meet requirement for admission to SNF for rehabilitative therapy   Intermittent chest pain/question esophagitis: Evaluated by cardiology 08/21/2021 who felt the chest pain was nonischemic in etiology Potentially GI given patient's description of pain as being in mid chest and radiating up from the back Continue Protonix-no signs of bleeding varices but varices can cause similar discomfort   Chronic systolic heart failure/severe tricuspid regurgitation:  Echocardiogram completed at [facility] in December revealed an EF of 40 to 45% with severe tricuspid regurgitation and significant biatrial dilatation Continue current diuretics.  No peripheral edema. Continue beta-blocker Given chronic suboptimal blood pressure readings not a candidate for additional medication such as ARB or ACE inhibitor   Chronic atrial fibrillation:  Continue beta-blocker and Eliquis CHA2DS2-VASc = 3   History of recurrent PE and DVT  IVC filter in place.  Unfortunately not compliant with anticoagulation with work-up  at [facility] revealing new PE Continue with Eliquis Noted with severe TR on recent echo at outside hospital-possibly associated with pulmonary hypertension and this could be related to acute on chronic recurrent PE   Suicidal ideation, MDD, PTSD: See report for suicidal ideation 08/20/2021. Evaluated by psych initially recommended inpatient psychiatric facility but subsequently has cleared patient as of 12/18 and no longer recommends inpatient psych or safety sitter Patient would benefit from outpatient substance abuse treatment as recommended by the psychiatric team   Homelessness:  Patient receives disability check $914 per month and has Medicaid to pay for her medications. Patient reports that since she would be living alone she does not have enough money to pay rent and pay for other expenses including utilities.  She also reports that she does not have enough money to stay a long stay hotel monthly   Severe visual impairment Patient reports she is able to see out of right eye but unable to see out of left eye Unfortunately unable to determine actual acuity in right eye.  If right eye visual acuity is 20/200 or worse she would qualify for condition of legally blind     Data Reviewed: Basic Metabolic Panel: Recent Labs  Lab 08/30/21 1106  NA 135  K 4.0  CL 109  CO2 18*  GLUCOSE 114*  BUN 18  CREATININE 1.12*  CALCIUM 10.4*      Scheduled Meds:  apixaban  5 mg Oral BID   ARIPiprazole  2 mg Oral Daily   feeding supplement  237 mL Oral BID BM   ferrous sulfate  325 mg Oral [id number]   folic acid  1 mg Oral Daily   lactulose  10 g Oral BID   latanoprost  1 drop Both Eyes QHS   melatonin  5 mg Oral QHS   metoprolol tartrate  12.5 mg Oral BID   multivitamin with minerals  1 tablet Oral Daily   pantoprazole  20 mg Oral Daily   promethazine  25 mg Oral Once   spironolactone  25 mg Oral Daily   sucralfate  1 g Oral TID WC & HS   thiamine  100 mg Oral Daily   torsemide   10 mg Oral Daily   Continuous Infusions:  Principal Problem:   Hypotension Active Problems:   AKI (acute kidney injury) ([place])   Atrial fibrillation, chronic (HCC)   Alcoholic cirrhosis (HCC)   Prolonged QT interval   Chronic anticoagulation   Noncompliance   Pulmonary embolism (HCC)   Chronic systolic CHF (congestive heart failure) (HCC)   MDD (major depressive disorder), recurrent episode, severe (HCC)   PTSD (post-traumatic stress disorder)   Atypical chest pain   Malnutrition of moderate degree   Consultants: Psychiatry Cardiology  Procedures: None  Antibiotics: None    Time spent: 35 minutes    Erin Hearing ANP  [facility] 7 am - 330 pm/M-F for direct patient care and secure chat Please refer to Amion for contact info 16  days

## 2021-09-06 ENCOUNTER — Other Ambulatory Visit (HOSPITAL_COMMUNITY): Payer: Self-pay

## 2021-09-06 DIAGNOSIS — Z91199 Patient's noncompliance with other medical treatment and regimen due to unspecified reason: Secondary | ICD-10-CM | POA: Diagnosis not present

## 2021-09-06 DIAGNOSIS — K7031 Alcoholic cirrhosis of liver with ascites: Secondary | ICD-10-CM | POA: Diagnosis not present

## 2021-09-06 DIAGNOSIS — I952 Hypotension due to drugs: Secondary | ICD-10-CM | POA: Diagnosis not present

## 2021-09-06 DIAGNOSIS — N179 Acute kidney failure, unspecified: Secondary | ICD-10-CM | POA: Diagnosis not present

## 2021-09-06 NOTE — Progress Notes (Signed)
Pt was DC via [place] transportation. Pt refused to bring all her lactulose, pt was informed on the importance of lactulose but pt refused at it was heavy for her to carry. Pt has a red luggage that was basically empty and RN offered to put it there but pt still refused to bring it. Charge nurse and pharmacy was informed and make arrangement for it.

## 2021-09-06 NOTE — Progress Notes (Signed)
CSW reviewed [employer] website to determine availability for patient transportation from [place] to [place]. There there is a scheduled departure on 09/07/20 at 12:30am to [place]. Patient's sister resides in [facility] and has agreed to pick up patient.  CSW scheduled an [name] for patient to go to the bus depot - pick up time is 1pm at the [facility] main entrance.  CSW notified RN of information.  Madilyn Fireman, MSW, LCSW Transitions of Care   Clinical Social Worker II [phone]

## 2021-09-06 NOTE — Discharge Summary (Addendum)
Physician Discharge Summary  Adrienne Villarreal [id number] DOB: 1960/10/13 DOA: 08/18/2021  PCP: Pcp, No  Admit date: 08/18/2021 Discharge date: 09/06/2021  Time spent: 35 minutes  Recommendations for Outpatient Follow-up:  Extensive counseling provided by our [facility] team (CM and LCSW).  Patient stated she preferred to discharge to the [place] area where her sister lives.  CM spoke with patient to obtain actual discharge address and phone number for patient's sister to confirm sister was okay with patient coming in to confirm address.  TOC providing [name] transportation to the train station in [place].  TOC has confirmed with patient that she has money in her account to obtain a ticket to the [place] area.  She has been provided with an application to apply for food stamps (she is eligible based on her limited income). Patient has been given the contact number for the [name].  She has been instructed to follow-up to manage all her chronic medical conditions.  Patient also states she has resources available in the [name] area to follow-up regarding her chronic medical problems Patient has also been given resources regarding outpatient follow-up regarding her substance abuse history and psychiatric issues Her prescriptions have been sent to the transitions of care pharmacy and have been given to her prior to discharge.   Discharge Diagnoses:  Principal Problem:   Hypotension Active Problems:   AKI (acute kidney injury) ([place])   Atrial fibrillation, chronic (HCC)   Alcoholic cirrhosis (HCC)   Prolonged QT interval   Chronic anticoagulation   Noncompliance   Pulmonary embolism (HCC)   Chronic systolic CHF (congestive heart failure) (HCC)   MDD (major depressive disorder), recurrent episode, severe (HCC)   PTSD (post-traumatic stress disorder)   Atypical chest pain   Malnutrition of moderate degree  SEPSIS RULED OUT  Discharge Condition: Stable  Diet  recommendation: Regular  Filed Weights   09/01/21 2125 09/02/21 0456 09/06/21 0500  Weight: 58.4 kg 58.4 kg 59.4 kg    History of present illness:  61 year old with past medical history significant for alcohol abuse, alcoholic cirrhosis, chronic systolic heart failure, A. fib, PE on Eliquis, homelessness recent admission from 08/10/2021 until 08/18/2021 at [facility] for cirrhosis with ascites requiring paracentesis and diuresis was also found to have PE for which she was restarted on Eliquis.  She was discharged to alcohol rehab center 08/18/2021, because she was ambulating with a walker, she could not get into the center and she was driven to a homeless shelter.  The shelter did not had any bed and patient was directed to the ED.     On presentation she was hypotensive with systolic blood pressure in the 70s and 80s.  She was complaining of ongoing chest pain.  She received IV fluids.  She was found to have a creatinine of 2.0.  Prior creatinine 1.2. She was initially evaluated by psych who IVC her and recommended inpatient psych facility.  Subsequently she improved and now has been cleared by psych.  Patient does not need inpatient psychiatric facility admission.  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 Recently underwent paracentesis at [facility] but stated had significant discomfort  during procedure and prefers to use diuretics to treat ascites Current ascites soft and not impinging on respiratory effort and are nontender Meld score is 12 with an estimated 34-month mortality of 6% Continue spironolactone and Demadex Continue Chronulac to prevent encephalopathy Mild leukopenia secondary to chronic cirrhosis; platelets are normal as well as recent INR at outside hospital   Physical deconditioning Patient has significantly improved in regards to her ability to mobilize without walker although she continues to have excessive fatigue and shortness of breath as she ambulates (PT documents ambulates about 25 feet without assistive device before onset of fatigue) Ambulatory sats normal Does not meet requirement for admission to SNF for rehabilitative therapy   Intermittent chest pain/question esophagitis: Evaluated by cardiology 08/21/2021 who felt the chest pain was nonischemic in etiology Potentially GI given patient's description of pain as being in mid chest and radiating up from the epigastrum Continue Protonix-no signs of bleeding varices but varices can cause similar discomfort   Chronic systolic heart failure/severe tricuspid regurgitation:  Echocardiogram completed at [facility] in December revealed an EF of 40 to 45% with severe tricuspid regurgitation and significant biatrial dilatation Continue current diuretics.  No peripheral edema. Continue beta-blocker Given chronic suboptimal blood pressure readings not a candidate for additional medication such as ARB or ACE inhibitor   Chronic atrial fibrillation:  Continue beta-blocker and Eliquis CHA2DS2-VASc = 3   History of recurrent PE and DVT  IVC filter in place.  Unfortunately not compliant with anticoagulation with work-up at [facility] revealing new PE Continue with Eliquis Noted with severe TR on recent echo at outside hospital-possibly associated with pulmonary hypertension and this could be  related to acute on chronic recurrent PE   Suicidal ideation, MDD, PTSD: Evaluated by psych initially recommended inpatient psychiatric facility but subsequently has cleared patient as of 12/18  Patient would benefit from outpatient substance abuse treatment as recommended by the psychiatric team   Homelessness:  Patient receives disability check $914 per month and has Medicaid to pay for her medications. Patient reports that since she would be living alone she does not have enough money to pay rent and pay for other expenses including utilities.  She also reports that she does not have enough money to stay a long stay hotel monthly Per LCSW given her limited income she is eligible for food stamps-application provided prior to dc   Severe visual impairment Patient reports she is able to see out of right eye but unable to see out of left eye Unfortunately unable to determine actual acuity in right eye.  If right eye visual acuity is 20/200 or worse she would meet the federal guidelines for legally blind  Procedures: None  Consultations: Psychiatry Cardiology  Discharge Exam: Vitals:   09/05/21 2104 09/06/21 0901  BP: 98/70 121/79  Pulse: 77 82  Resp: 18 18  Temp: 98.3 F (36.8 C) 97.8 F (36.6 C)  SpO2: 95% 100%   Constitutional: NAD, calm, comfortable Respiratory: clear to auscultation bilaterally, no wheezing, no crackles. Normal respiratory effort. No accessory muscle use. RA Cardiovascular: Regular rate and rhythm, no murmurs / rubs / gallops. No extremity edema. 2+ pedal pulses.  Abdomen: no tenderness, no masses palpated.  Positive nontender hepatic enlargement.  Soft nontender abdominal ascites -bowel sounds positive. LBM Neurologic: CN 2-12 grossly intact. Sensation  intact, DTR normal. Strength 4/5 x all 4 extremities.  Psychiatric: Normal judgment and insight. Alert and oriented x 3. Normal mood.   Discharge Instructions   Discharge Instructions     Diet - low  sodium heart healthy   Complete by: As directed    Diet - low sodium heart healthy   Complete by: As directed    Increase activity slowly   Complete by: As directed    Increase activity slowly   Complete by: As directed       Allergies as of 09/06/2021       Reactions   Acetaminophen Other (See Comments)   Patient has been instructed to avoid due to cirrhosis   Ampicillin Anaphylaxis, Shortness Of Breath, Swelling, Other (See Comments)   Comment from [facility] records:  prescribed Augmentin 06/2017 but refused to take it, cefazolin 05/2016, Keflex 05/2016 Tolerated Ancef June 2021   Contrast Media [iodinated Contrast Media] Anaphylaxis, Itching, Swelling   Pharyngeal swelling   Haloperidol Shortness Of Breath, Swelling, Anxiety, Other (See Comments)   Drug-induced dystonia. Patient states it makes her have "stroke like symptoms". "Body twisted up, had to be on Cogentin for 2 weeks."   Hydrocodone Nausea And Vomiting, Shortness Of Breath   Tolerates morphine   Hydroxyzine Nausea And Vomiting, Other (See Comments), Palpitations   "slows my breathing"   Lisinopril Shortness Of Breath, Other (See Comments)   Cough   Prochlorperazine Itching, Rash, Other (See Comments)   Dystonia per RN, patient takes promethazine at home without any issues per patient   Quetiapine Anaphylaxis, Swelling, Other (See Comments)   Swelling of throat. Edema of pharynx   Sulfamethoxazole-trimethoprim Shortness Of Breath   Carbamazepine Rash   Clindamycin Itching, Nausea And Vomiting      Fentanyl Nausea And Vomiting, Rash   Reports breaking out on chest and itching Pt states she tolerates morphine   Fluoxetine Other (See Comments)   Pt states this medication makes her "feel weird and lethargic".   Ondansetron Nausea And Vomiting   Peanut-containing Drug Products Nausea And Vomiting, Rash   Patient stated that she gets sick.   Tetracyclines & Related Itching, Swelling   Ziprasidone Other (See Comments)    Other reaction(s): SIMPLE CARDIOVASCULAR DIS EXC CHF,ISCHEMIC HEART DIS & HYPERTN   Ibuprofen Nausea And Vomiting   Metoclopramide Nausea And Vomiting   Tolerates promethazine   Oxycodone Nausea And Vomiting   Can tolerate morphine        Medication List     STOP taking these medications    metoprolol succinate 25 MG 24 hr tablet Commonly known as: TOPROL-XL       TAKE these medications    ARIPiprazole 2 MG tablet Commonly known as: ABILIFY Take 1 tablet (2 mg total) by mouth daily.   Cholecalciferol 25 MCG (1000 UT) tablet Take 1,000 Units by mouth daily.   Eliquis 5 MG Tabs tablet Generic drug: apixaban Take 1 tablet (5 mg total) by mouth 2 (two) times daily.   ferrous sulfate 325 (65 FE) MG tablet Take 325 mg by mouth daily with breakfast.   folic acid 1 MG tablet Commonly known as: FOLVITE Take 1 tablet (1 mg total) by mouth daily. What changed:  how much to take when to take this   lactulose (encephalopathy) 10 GM/15ML Soln Commonly known as: CHRONULAC Take 15 mLs (10 g total) by mouth 2 (two) times daily. What changed:  how much to take when to take this   latanoprost 0.005 %  ophthalmic solution Commonly known as: XALATAN Place 1 drop into both eyes at bedtime.   melatonin 5 MG Tabs Take 1 tablet (5 mg total) by mouth at bedtime.   metoprolol tartrate 25 MG tablet Commonly known as: LOPRESSOR Take 0.5 tablets (12.5 mg total) by mouth 2 (two) times daily. What changed:  how much to take when to take this   pantoprazole 40 MG tablet Commonly known as: PROTONIX Take 40 mg by mouth daily.   spironolactone 25 MG tablet Commonly known as: ALDACTONE Take 25 mg by mouth daily.   sucralfate 1 GM/10ML suspension Commonly known as: CARAFATE Take 10 mLs (1 g total) by mouth 4 (four) times daily -  with meals and at bedtime.   thiamine 100 MG tablet Take 1 tablet (100 mg total) by mouth daily. What changed:  how much to take when to take  this   torsemide 10 MG tablet Commonly known as: DEMADEX Take 10 mg by mouth 2 (two) times daily with a meal.       Allergies  Allergen Reactions   Acetaminophen Other (See Comments)    Patient has been instructed to avoid due to cirrhosis    Ampicillin Anaphylaxis, Shortness Of Breath, Swelling and Other (See Comments)    Comment from [facility] records:  prescribed Augmentin 06/2017 but refused to take it, cefazolin 05/2016, Keflex 05/2016 Tolerated Ancef June 2021    Contrast Media [Iodinated Contrast Media] Anaphylaxis, Itching and Swelling    Pharyngeal swelling   Haloperidol Shortness Of Breath, Swelling, Anxiety and Other (See Comments)    Drug-induced dystonia. Patient states it makes her have "stroke like symptoms". "Body twisted up, had to be on Cogentin for 2 weeks."   Hydrocodone Nausea And Vomiting and Shortness Of Breath    Tolerates morphine   Hydroxyzine Nausea And Vomiting, Other (See Comments) and Palpitations    "slows my breathing"    Lisinopril Shortness Of Breath and Other (See Comments)    Cough    Prochlorperazine Itching, Rash and Other (See Comments)    Dystonia per RN, patient takes promethazine at home without any issues per patient    Quetiapine Anaphylaxis, Swelling and Other (See Comments)    Swelling of throat. Edema of pharynx    Sulfamethoxazole-Trimethoprim Shortness Of Breath   Carbamazepine Rash   Clindamycin Itching and Nausea And Vomiting        Fentanyl Nausea And Vomiting and Rash    Reports breaking out on chest and itching Pt states she tolerates morphine    Fluoxetine Other (See Comments)    Pt states this medication makes her "feel weird and lethargic".    Ondansetron Nausea And Vomiting   Peanut-Containing Drug Products Nausea And Vomiting and Rash    Patient stated that she gets sick.   Tetracyclines & Related Itching and Swelling   Ziprasidone Other (See Comments)    Other reaction(s): SIMPLE CARDIOVASCULAR DIS EXC  CHF,ISCHEMIC HEART DIS & HYPERTN   Ibuprofen Nausea And Vomiting   Metoclopramide Nausea And Vomiting    Tolerates promethazine   Oxycodone Nausea And Vomiting    Can tolerate morphine    Follow-up Information     [facility]. Go on 10/16/2021.   Why: You are scheduled for a hospital follow up on Monday,  February 13 at 2:30 pm with Dr. Aundra Dubin.Minette Brine information: [facility] [phone]  The results of significant diagnostics from this hospitalization (including imaging, microbiology, ancillary and laboratory) are listed below for reference.    Significant Diagnostic Studies: DG Chest Port 1 View  Result Date: 08/18/2021 CLINICAL DATA:  Shortness of breath EXAM: PORTABLE CHEST 1 VIEW COMPARISON:  None. FINDINGS: Enlarged cardiac silhouette. Trace left pleural effusion. No focal airspace consolidation. Right midlung scarring. No pneumothorax. No acute osseous abnormality. IMPRESSION: Cardiomegaly.  Trace left pleural effusion. Electronically Signed   By: Maurine Simmering M.D.   On: 08/18/2021 18:10     Labs: Basic Metabolic Panel: Recent Labs  Lab 08/30/21 1106  NA 135  K 4.0  CL 109  CO2 18*  GLUCOSE 114*  BUN 18  CREATININE 1.12*  CALCIUM 10.4*        Signed:  Erin Hearing ANP [facility] 09/06/2021, 10:13 AM

## 2021-09-06 NOTE — Progress Notes (Addendum)
Pt has DC order. RN called TOC and said no other new med was ordered and all med was delivered on prior days. RN will send the pt to lobby by 1pm by Benedetto Goad. Pt refused her AVS DC paper be discussed. IV was removed, all her belongings were kept together.

## 2021-09-06 NOTE — TOC Progression Note (Signed)
Transition of Care [facility]) - Progression Note    Patient Details  Name: Adrienne Villarreal MRN: [id number] Date of Birth: 02-14-1961  Transition of Care [facility]) CM/SW [place], RN Phone Number: 09/06/2021, 9:52 AM  Clinical Narrative:    CM met with the patient at the bedside to discuss transitions of care plans and discharge today from the hospital.  I spoke with the patient in depth and discussed options for hotel/homeless shelter in the area and the patient states that she refuses to go to the [facility] today for shelter coordination.  I gave the patient information for local hotel [facility]) with available rooms open today for her stay.  The patient has available funds in her bank account to pay for a room but the patient states that she is not living here in the area and plans to take a [employer] train today to [place], [place] near her sister's home in [facility] [place], [place].  I explained to the patient that [place] train was available this evening to return to [place], [place] area and she is aware and she plans to use her own funds to purchase a ticket at the train station this afternoon when she arrives.  I spoke with Madilyn Fireman, MSW and she arranged [facility] transportation from the hospital to the train station at 1 pm today at the [facility] entrance and bedside nursing is aware.   The patient gave me her sister's contact information to speak with her on the phone regarding her discharge plan today.  I called and spoke with the patient's sister, Kerrie Buffalo [phone] [address], [facility], [place]) on the phone and explained that the patient was medically ready for discharge from the hospital and plans to take [facility] transport to the [place] train station in [place], [place] today and buy a ticket at the station.  The patient's sister offered an invitation for the patient to stay at her home when she arrives in the area.  The patient states that she has already spoke with her sister on the  phone.  I scheduled a follow up appointment for the patient at [facility] as backup plan for patient if she chooses to stay in a hotel in the area.  I gave the patient a paper copy of [employer] application and envelope to send the application to DSS to be processed.  The patient is aware of process to apply for food stamps.  TOC medications have been ordered to be delivered to the patient's hospital room this morning.  The patient was given Outpatient Substance Abuse resources pamphlet and this was also included in the discharge instructions.  The patient was given list of area Shelter Resources as well in case the patient chooses to remain in the area.  The patient has a rollator in the room and clothing for home.   CM and MSW with DTP Team will continue to follow the patient for discharge to [place] today to return to [place], [place] near her sister's home in [place], [place].  The patient's sister, Melody Claiborne Billings is aware of the above discharge plan.    Expected Discharge Plan: Homeless Shelter Barriers to Discharge: No Barriers Identified  Expected Discharge Plan and Services Expected Discharge Plan: Homeless Shelter In-house Referral: Clinical Social Work, PCP / Curator Services: [facility], CM Consult, Follow-up appt scheduled, Medication Assistance (Patient given [place] food stamps application.)   Living arrangements for the  past 2 months: Homeless Expected Discharge Date: 09/01/21                                     Social Determinants of Health (SDOH) Interventions    Readmission Risk Interventions Readmission Risk Prevention Plan 09/06/2021  Transportation Screening Complete  PCP or Specialist Appt within 5-7 Days Complete  Home Care Screening Complete  Medication Review (RN CM) Complete  Some recent data might be hidden

## 2021-09-06 NOTE — Progress Notes (Signed)
Occupational Therapy Treatment Note  Completed education regarding strategies to compensate for low vision. Glasses and magnifier are helping pt function. Pt appreciative. OT signing off.   09/06/21 1000  OT Visit Information  Last OT Received On 09/06/21  History of Present Illness 61 year old female with history of alcohol abuse, alcoholic cirrhosis, chronic systolic CHF, atrial fibrillation and PEs on Eliquis, anxiety, homelessness, recent admission from 08/10/2021-08/18/2021 at [facility] for cirrhosis with ascites requiring paracentesis and diuresis and was also found to have pulmonary embolism for which she was restarted on Eliquis (apparently she had been noncompliant with her anticoagulation).  She was discharged to alcohol rehab center on 08/18/2021 but because she was ambulating with a walker, she could not get into the center and she was driven to a homeless shelter; it did not have any beds and patient was sent to emergency room.  On presentation, she was hypotensive with systolic blood pressure in the 70s to 80s and patient complained of ongoing chest pain  Precautions  Precautions None  Pain Assessment  Pain Assessment No/denies pain  ADL  Overall ADL's  Independent  General Comments  General comments (skin integrity, edema, etc.) Reviewed compensatory strateiges for low vision. Pt reports glasses adn manifier with light are helping. No further questions  OT - End of Session  Activity Tolerance Patient tolerated treatment well  Patient left in bed;with call bell/phone within reach  OT Assessment/Plan  OT Plan All goals met and education completed, patient discharged from OT services  Follow Up Recommendations No OT follow up  AM-PAC OT "6 Clicks" Daily Activity Outcome Measure (Version 2)  Help from another person eating meals? 4  Help from another person taking care of personal grooming? 4  Help from another person toileting, which includes using toliet, bedpan, or  urinal? 4  Help from another person bathing (including washing, rinsing, drying)? 4  Help from another person to put on and taking off regular upper body clothing? 4  Help from another person to put on and taking off regular lower body clothing? 4  6 Click Score 24  Progressive Mobility  What is the highest level of mobility based on the progressive mobility assessment? Level 6 (Walks independently in room and hall) - Balance while walking in room without assist - Complete  OT Goal Progression  Progress towards OT goals Goals met/education completed, patient discharged from OT  OT Time Calculation  OT Start Time (ACUTE ONLY) 0846  OT Stop Time (ACUTE ONLY) 0858  OT Time Calculation (min) 12 min  OT General Charges  $OT Visit 1 Visit  OT Treatments  $Therapeutic Activity 8-22 mins   Maurie Boettcher, OT/L   Acute OT Clinical Specialist Acute Rehabilitation Services Pager [phone] Office [phone]

## 2021-09-13 ENCOUNTER — Other Ambulatory Visit (HOSPITAL_COMMUNITY): Payer: Self-pay

## 2021-09-18 ENCOUNTER — Telehealth: Payer: Self-pay | Admitting: Internal Medicine

## 2021-09-25 ENCOUNTER — Other Ambulatory Visit (HOSPITAL_COMMUNITY): Payer: Self-pay

## 2021-09-25 ENCOUNTER — Telehealth (HOSPITAL_COMMUNITY): Payer: Self-pay

## 2021-09-25 NOTE — Telephone Encounter (Signed)
Transitions of Care Pharmacy   Call attempted for a pharmacy transitions of care follow-up. Unable to leave voicemail.  Call attempt #1. Will follow-up in 2-3 days.    

## 2021-09-26 ENCOUNTER — Telehealth (HOSPITAL_COMMUNITY): Payer: Self-pay | Admitting: Pharmacist

## 2021-09-26 NOTE — Telephone Encounter (Signed)
Transitions of Care Pharmacy   Call attempted for a pharmacy transitions of care follow-up. Voicemail full.   Call attempt #2. Will follow-up in 1-3 days.

## 2021-09-27 ENCOUNTER — Telehealth (HOSPITAL_COMMUNITY): Payer: Self-pay | Admitting: Pharmacist

## 2021-09-27 ENCOUNTER — Other Ambulatory Visit (HOSPITAL_COMMUNITY): Payer: Self-pay

## 2021-09-27 NOTE — Telephone Encounter (Signed)
Transitions of Care Pharmacy   Call attempted for a pharmacy transitions of care follow-up. Voicemail full.   Call attempt #3. No further follow-up

## 2021-10-16 ENCOUNTER — Inpatient Hospital Stay: Payer: Self-pay | Admitting: Internal Medicine

## 2021-12-02 DEATH — deceased

## 2022-11-29 IMAGING — DX DG CHEST 1V PORT
1 series · 1 of 1 positions shown · non-contrast
Comparison: None.

CLINICAL DATA: Shortness of breath

EXAM:
PORTABLE CHEST 1 VIEW

[chest]
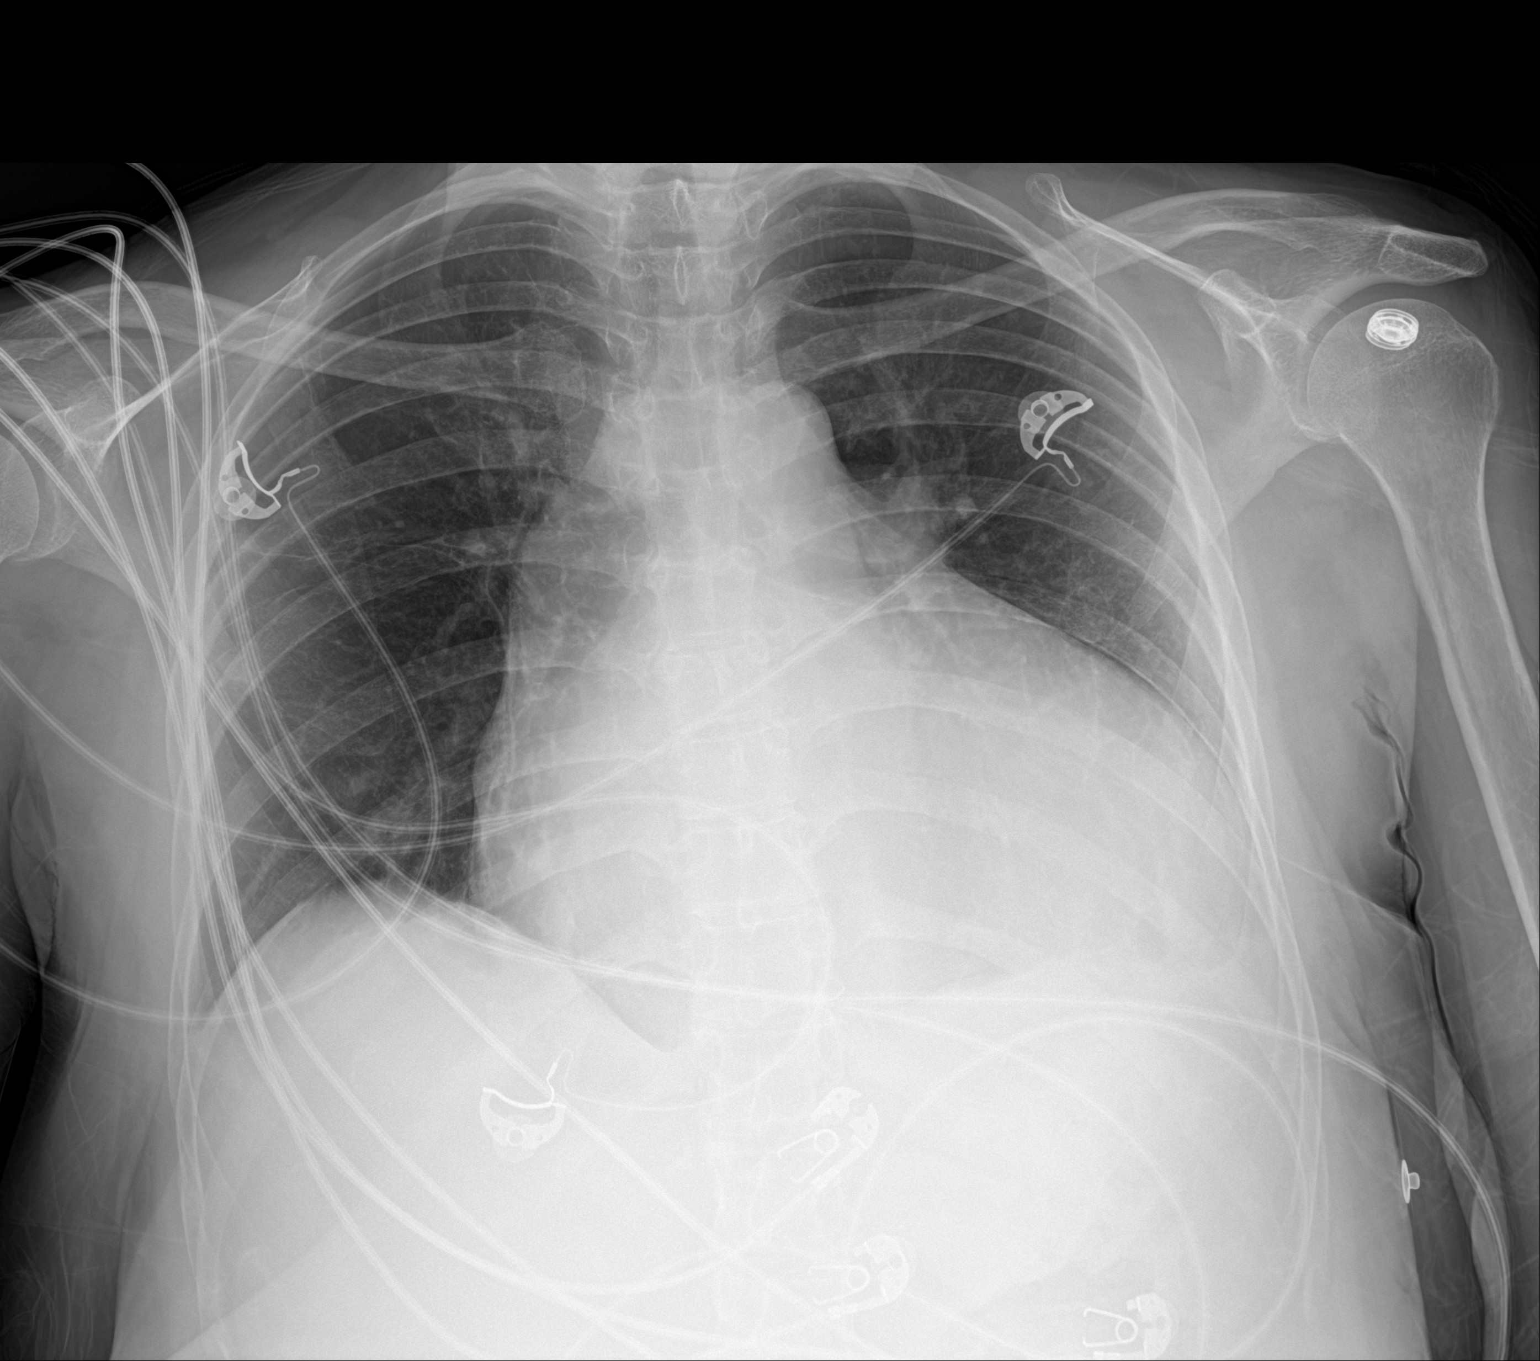

[1 of 1 positions shown; findings below may reference images not displayed]

FINDINGS: Enlarged cardiac silhouette. Trace left pleural effusion. No focal
airspace consolidation. Right midlung scarring. No pneumothorax. No
acute osseous abnormality.
IMPRESSION: Cardiomegaly.  Trace left pleural effusion.
